# Patient Record
Sex: Female | Born: 1975 | Race: White | Hispanic: No | Marital: Single | Smoking: Current every day smoker
Health system: Southern US, Community
[De-identification: ages and names within clinical notes are randomized; demographics above are authoritative.]

## PROBLEM LIST (undated history)

## (undated) DIAGNOSIS — T4145XA Adverse effect of unspecified anesthetic, initial encounter: Secondary | ICD-10-CM

## (undated) DIAGNOSIS — I471 Supraventricular tachycardia, unspecified: Secondary | ICD-10-CM

## (undated) DIAGNOSIS — T8859XA Other complications of anesthesia, initial encounter: Secondary | ICD-10-CM

## (undated) DIAGNOSIS — E162 Hypoglycemia, unspecified: Secondary | ICD-10-CM

## (undated) DIAGNOSIS — Z8489 Family history of other specified conditions: Secondary | ICD-10-CM

## (undated) DIAGNOSIS — R0600 Dyspnea, unspecified: Secondary | ICD-10-CM

## (undated) DIAGNOSIS — D649 Anemia, unspecified: Secondary | ICD-10-CM

## (undated) DIAGNOSIS — J45909 Unspecified asthma, uncomplicated: Secondary | ICD-10-CM

## (undated) DIAGNOSIS — M1990 Unspecified osteoarthritis, unspecified site: Secondary | ICD-10-CM

## (undated) DIAGNOSIS — G43909 Migraine, unspecified, not intractable, without status migrainosus: Secondary | ICD-10-CM

## (undated) DIAGNOSIS — G709 Myoneural disorder, unspecified: Secondary | ICD-10-CM

## (undated) DIAGNOSIS — K219 Gastro-esophageal reflux disease without esophagitis: Secondary | ICD-10-CM

## (undated) HISTORY — PX: BREAST SURGERY: SHX581

## (undated) HISTORY — PX: TUBAL LIGATION: SHX77

## (undated) HISTORY — PX: ABDOMINAL HYSTERECTOMY: SHX81

---
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
## 2018-07-12 ENCOUNTER — Ambulatory Visit (INDEPENDENT_AMBULATORY_CARE_PROVIDER_SITE_OTHER): Payer: Self-pay

## 2018-07-12 ENCOUNTER — Ambulatory Visit
Admission: EM | Admit: 2018-07-12 | Discharge: 2018-07-12 | Disposition: A | Discharge status: Home / Self Care | Payer: Self-pay | Attending: Family Medicine | Admitting: Family Medicine

## 2018-07-12 ENCOUNTER — Encounter: Payer: Self-pay | Admitting: Emergency Medicine

## 2018-07-12 ENCOUNTER — Other Ambulatory Visit: Payer: Self-pay

## 2018-07-12 DIAGNOSIS — R05 Cough: Secondary | ICD-10-CM

## 2018-07-12 DIAGNOSIS — R059 Cough, unspecified: Secondary | ICD-10-CM

## 2018-07-12 DIAGNOSIS — J4521 Mild intermittent asthma with (acute) exacerbation: Secondary | ICD-10-CM

## 2018-07-12 DIAGNOSIS — R0602 Shortness of breath: Secondary | ICD-10-CM

## 2018-07-12 DIAGNOSIS — F17210 Nicotine dependence, cigarettes, uncomplicated: Secondary | ICD-10-CM

## 2018-07-12 HISTORY — DX: Supraventricular tachycardia: I47.1

## 2018-07-12 HISTORY — DX: Unspecified asthma, uncomplicated: J45.909

## 2018-07-12 HISTORY — DX: Migraine, unspecified, not intractable, without status migrainosus: G43.909

## 2018-07-12 HISTORY — DX: Supraventricular tachycardia, unspecified: I47.10

## 2018-07-12 MED ORDER — HYDROCOD POLST-CPM POLST ER 10-8 MG/5ML PO SUER: 5.0000 mL | Freq: Every evening | ORAL | 0 refills | Status: DC | PRN

## 2018-07-12 MED ORDER — PREDNISONE 20 MG PO TABS: 20.0000 mg | ORAL_TABLET | Freq: Every day | ORAL | 0 refills | Status: DC

## 2018-07-12 MED ORDER — BENZONATATE 200 MG PO CAPS: 200.0000 mg | ORAL_CAPSULE | Freq: Three times a day (TID) | ORAL | 0 refills | Status: DC | PRN

## 2018-07-12 NOTE — ED Provider Notes (Signed)
[facility]    CSN: [id number] Arrival date & time: 07/12/18  1320     History   Chief Complaint Chief Complaint  Patient presents with  . Cough  . Shortness of Breath    HPI Margalit Eisenbeis is a 43 y.o. female.   The history is provided by the patient.  Cough  Associated symptoms: shortness of breath and wheezing   Shortness of Breath  Associated symptoms: cough and wheezing   URI  Presenting symptoms: cough   Severity:  Moderate Onset quality:  Sudden Timing:  Constant Progression:  Worsening Chronicity:  New Relieved by:  Inhaler (however states having to use more) Associated symptoms: wheezing   Associated symptoms comment:  Shortness of breath Risk factors: no recent travel  Sick contacts: unknown.     Past Medical History:  Diagnosis Date  . Asthma   . Migraines   . SVT (supraventricular tachycardia) (HCC)     There are no active problems to display for this patient.   Past Surgical History:  Procedure Laterality Date  . ABDOMINAL HYSTERECTOMY    . BREAST SURGERY Right    lumpectomy  . TUBAL LIGATION      OB History   No obstetric history on file.      Home Medications    Prior to Admission medications   Medication Sig Start Date End Date Taking? Authorizing Provider  albuterol (VENTOLIN HFA) 108 (90 Base) MCG/ACT inhaler Inhale into the lungs every 6 (six) hours as needed for wheezing or shortness of breath.   Yes [provider]  benzonatate (TESSALON) 200 MG capsule Take 1 capsule (200 mg total) by mouth 3 (three) times daily as needed. 07/12/18   Payton Mccallum, MD  chlorpheniramine-HYDROcodone (TUSSIONEX PENNKINETIC ER) 10-8 MG/5ML SUER Take 5 mLs by mouth at bedtime as needed. 07/12/18   Payton Mccallum, MD  predniSONE (DELTASONE) 20 MG tablet Take 1 tablet (20 mg total) by mouth daily. 07/12/18   Payton Mccallum, MD    Family History Family History  Problem Relation Age of Onset  . Fibromyalgia Mother   . Asthma  Mother   . Cancer Mother        breast    Social History Social History   Tobacco Use  . Smoking status: Current Every Day Smoker    Packs/day: 0.50  . Smokeless tobacco: Never Used  Substance Use Topics  . Alcohol use: Not Currently  . Drug use: Not Currently     Allergies   Aspirin   Review of Systems Review of Systems  Respiratory: Positive for cough, shortness of breath and wheezing.      Physical Exam Triage Vital Signs ED Triage Vitals  Enc Vitals Group     BP 07/12/18 1341 113/71     Pulse Rate 07/12/18 1341 90     Resp 07/12/18 1341 18     Temp 07/12/18 1341 98.2 F (36.8 C)     Temp Source 07/12/18 1341 Oral     SpO2 07/12/18 1341 98 %     Weight 07/12/18 1335 124 lb (56.2 kg)     Height 07/12/18 1335 5\' 3"  (1.6 m)     Head Circumference --      Peak Flow --      Pain Score 07/12/18 1335 7     Pain Loc --      Pain Edu? --      Excl. in [place]? --    No data found.  Updated Vital  Signs BP 113/71 (BP Location: Left Arm)   Pulse 90   Temp 98.2 F (36.8 C) (Oral)   Resp 18   Ht 5\' 3"  (1.6 m)   Wt 56.2 kg   SpO2 98%   BMI 21.97 kg/m   Visual Acuity Right Eye Distance:   Left Eye Distance:   Bilateral Distance:    Right Eye Near:   Left Eye Near:    Bilateral Near:     Physical Exam Vitals signs and nursing note reviewed.  Constitutional:      General: She is not in acute distress.    Appearance: She is well-developed. She is not toxic-appearing or diaphoretic.  HENT:     Head: Normocephalic and atraumatic.  Neck:     Thyroid: No thyromegaly.  Cardiovascular:     Rate and Rhythm: Normal rate.  Pulmonary:     Effort: Pulmonary effort is normal. No respiratory distress.     Breath sounds: No stridor.  Neurological:     Mental Status: She is alert.      UC Treatments / Results  Labs (all labs ordered are listed, but only abnormal results are displayed) Labs Reviewed - No data to display  EKG None  Radiology Dg Chest 2  View  Result Date: 07/12/2018 CLINICAL DATA:  Cough, shortness of breath. EXAM: CHEST - 2 VIEW COMPARISON:  None. FINDINGS: The heart size and mediastinal contours are within normal limits. Both lungs are clear. No pneumothorax or pleural effusion is noted. Hyperexpansion of the lungs is noted. The visualized skeletal structures are unremarkable. IMPRESSION: No active cardiopulmonary disease. Electronically Signed   By: Lupita RaiderJames  Green Jr M.D.   On: 07/12/2018 14:13    Procedures Procedures (including critical care time)  Medications Ordered in UC Medications - No data to display  Initial Impression / Assessment and Plan / UC Course  I have reviewed the triage vital signs and the nursing notes.  Pertinent labs & imaging results that were available during my care of the patient were reviewed by me and considered in my medical decision making (see chart for details).      Final Clinical Impressions(s) / UC Diagnoses   Final diagnoses:  Cough  Mild intermittent asthma with acute exacerbation     Discharge Instructions     Continue albuterol inhaler Monitor symptoms Follow [place] DHHS guidelines (copy given)    ED Prescriptions    Medication Sig Dispense Auth. Provider   predniSONE (DELTASONE) 20 MG tablet Take 1 tablet (20 mg total) by mouth daily. 5 tablet Jayston Trevino, Pamala Hurryrlando, MD   benzonatate (TESSALON) 200 MG capsule Take 1 capsule (200 mg total) by mouth 3 (three) times daily as needed. 30 capsule Payton Mccallumonty, Emaree Chiu, MD   chlorpheniramine-HYDROcodone (TUSSIONEX PENNKINETIC ER) 10-8 MG/5ML SUER Take 5 mLs by mouth at bedtime as needed. 30 mL Payton Mccallumonty, Doloros Kwolek, MD      1. Chest x-ray result and diagnosis reviewed with patient 2. rx as per orders above; reviewed possible side effects, interactions, risks and benefits  3. Recommend supportive treatment as above 4. Follow-up prn if symptoms worsen or don't improve Controlled Substance Prescriptions [place] Controlled Substance Registry consulted?  Not Applicable   Payton Mccallumonty, Renalda Locklin, MD 07/12/18 1451

## 2018-07-12 NOTE — ED Triage Notes (Signed)
Pt c/o cough, and shortness of breath. She has known asthma. She has been using her inhaler but within an hour her SOB Is back. Denies fever. Started about a week and a half ago.  She also has some body aches. She works at a [employer]

## 2018-07-12 NOTE — Discharge Instructions (Signed)
Continue albuterol inhaler Monitor symptoms Follow [place] DHHS guidelines (copy given)

## 2018-09-12 ENCOUNTER — Other Ambulatory Visit: Payer: Self-pay

## 2018-09-12 ENCOUNTER — Encounter: Payer: Self-pay | Admitting: Emergency Medicine

## 2018-09-12 ENCOUNTER — Ambulatory Visit
Admission: EM | Admit: 2018-09-12 | Discharge: 2018-09-12 | Disposition: A | Discharge status: Home / Self Care | Payer: Self-pay | Attending: Family Medicine | Admitting: Family Medicine

## 2018-09-12 DIAGNOSIS — N6320 Unspecified lump in the left breast, unspecified quadrant: Secondary | ICD-10-CM

## 2018-09-12 DIAGNOSIS — K625 Hemorrhage of anus and rectum: Secondary | ICD-10-CM

## 2018-09-12 DIAGNOSIS — R1013 Epigastric pain: Secondary | ICD-10-CM

## 2018-09-12 LAB — COMPREHENSIVE METABOLIC PANEL
ALT: 15 U/L (ref 0–44)
AST: 16 U/L (ref 15–41)
Albumin: 4.5 g/dL (ref 3.5–5.0)
Alkaline Phosphatase: 69 U/L (ref 38–126)
Anion gap: 8 (ref 5–15)
BUN: 10 mg/dL (ref 6–20)
CO2: 24 mmol/L (ref 22–32)
Calcium: 9.2 mg/dL (ref 8.9–10.3)
Chloride: 105 mmol/L (ref 98–111)
Creatinine, Ser: 0.63 mg/dL (ref 0.44–1.00)
GFR calc Af Amer: >60 mL/min (ref >60)
GFR calc non Af Amer: >60 mL/min (ref >60)
Glucose, Bld: 97 mg/dL (ref 70–99)
Potassium: 3.9 mmol/L (ref 3.5–5.1)
Sodium: 137 mmol/L (ref 135–145)
Total Bilirubin: 0.7 mg/dL (ref 0.3–1.2)
Total Protein: 7.5 g/dL (ref 6.5–8.1)

## 2018-09-12 LAB — CBC WITH DIFFERENTIAL/PLATELET
Abs Immature Granulocytes: 0.01 10*3/uL (ref 0.00–0.07)
Basophils Absolute: 0.1 10*3/uL (ref 0.0–0.1)
Basophils Relative: 1 %
Eosinophils Absolute: 0 10*3/uL (ref 0.0–0.5)
Eosinophils Relative: 1 %
HCT: 45.7 % (ref 36.0–46.0)
Hemoglobin: 15.9 g/dL — ABNORMAL HIGH (ref 12.0–15.0)
Immature Granulocytes: 0 %
Lymphocytes Relative: 24 %
Lymphs Abs: 1.3 10*3/uL (ref 0.7–4.0)
MCH: 30.2 pg (ref 26.0–34.0)
MCHC: 34.8 g/dL (ref 30.0–36.0)
MCV: 86.7 fL (ref 80.0–100.0)
Monocytes Absolute: 0.4 10*3/uL (ref 0.1–1.0)
Monocytes Relative: 8 %
Neutro Abs: 3.6 10*3/uL (ref 1.7–7.7)
Neutrophils Relative %: 66 %
Platelets: 251 10*3/uL (ref 150–400)
RBC: 5.27 MIL/uL — ABNORMAL HIGH (ref 3.87–5.11)
RDW: 13.6 % (ref 11.5–15.5)
WBC: 5.5 10*3/uL (ref 4.0–10.5)
nRBC: 0 % (ref 0.0–0.2)

## 2018-09-12 MED ORDER — PANTOPRAZOLE SODIUM 20 MG PO TBEC: 40.0000 mg | DELAYED_RELEASE_TABLET | Freq: Every day | ORAL | 0 refills | Status: DC

## 2018-09-12 NOTE — Discharge Instructions (Signed)
Medication as prescribed.  Referral to GI has been sent.  I have ordered the mammogram and Ultrasound.  Labs stable.  Take care  Dr. Lacinda Axon

## 2018-09-12 NOTE — ED Triage Notes (Signed)
Patient states she has been having rectal bleeding with BM for the past 2 weeks.

## 2018-09-12 NOTE — ED Provider Notes (Signed)
[facility]    CSN: [id number] Arrival date & time: 09/12/18  3151  History   Chief Complaint Chief Complaint  Patient presents with  . Rectal Bleeding   HPI   43 year old female presents with rectal bleeding.  Patient reports a 2-week history of bleeding with bowel movements.  Patient reports bright red blood as well as dark stool.  Patient states that she has been under a great deal of stress.  She is concerned that she has a "bleeding ulcer".  Patient reports epigastric pain.  She rates her pain is 7/10 in severity.  Patient also notes that she has lost weight in the past several weeks.  She states that 3 weeks ago she weighed 125 pounds.  I put her on the scale today and she weighed 112.5 pounds.  Patient reports that it is quite a large amount of blood with her bowel movements.  She states that is predominantly blood.  She denies constipation.  Denies diarrhea.  No other reported symptoms.  No other complaints.  Past Medical History:  Diagnosis Date  . Asthma   . Migraines   . SVT (supraventricular tachycardia) (HCC)    Past Surgical History:  Procedure Laterality Date  . ABDOMINAL HYSTERECTOMY    . BREAST SURGERY Right    lumpectomy  . TUBAL LIGATION     Home Medications    Prior to Admission medications   Medication Sig Start Date End Date Taking? Authorizing Provider  albuterol (VENTOLIN HFA) 108 (90 Base) MCG/ACT inhaler Inhale into the lungs every 6 (six) hours as needed for wheezing or shortness of breath.   Yes [provider]  pantoprazole (PROTONIX) 20 MG tablet Take 2 tablets (40 mg total) by mouth daily. 09/12/18   Coral Spikes, DO    Family History Family History  Problem Relation Age of Onset  . Fibromyalgia Mother   . Asthma Mother   . Cancer Mother        breast    Social History Social History   Tobacco Use  . Smoking status: Current Every Day Smoker    Packs/day: 0.50  . Smokeless tobacco: Never Used  Substance Use Topics   . Alcohol use: Not Currently  . Drug use: Not Currently     Allergies   Aspirin   Review of Systems Review of Systems  Constitutional: Positive for unexpected weight change.  Gastrointestinal: Positive for abdominal pain and rectal pain.   Physical Exam Triage Vital Signs ED Triage Vitals  Enc Vitals Group     BP 09/12/18 0832 112/72     Pulse Rate 09/12/18 0832 85     Resp 09/12/18 0832 18     Temp 09/12/18 0832 98.3 F (36.8 C)     Temp src --      SpO2 09/12/18 0832 99 %     Weight 09/12/18 0833 110 lb (49.9 kg)     Height 09/12/18 0833 5\' 3"  (1.6 m)     Head Circumference --      Peak Flow --      Pain Score 09/12/18 0833 7     Pain Loc --      Pain Edu? --      Excl. in [place]? --    Updated Vital Signs BP 112/72 (BP Location: Left Arm)   Pulse 85   Temp 98.3 F (36.8 C)   Resp 18   Ht 5\' 3"  (1.6 m)   Wt 49.9 kg   SpO2 99%  BMI 19.49 kg/m   Visual Acuity Right Eye Distance:   Left Eye Distance:   Bilateral Distance:    Right Eye Near:   Left Eye Near:    Bilateral Near:     Physical Exam Constitutional:      Comments: Thin female no acute distress.  HENT:     Head: Normocephalic and atraumatic.  Eyes:     General:        Right eye: No discharge.        Left eye: No discharge.     Conjunctiva/sclera: Conjunctivae normal.  Cardiovascular:     Rate and Rhythm: Normal rate and regular rhythm.     Comments: Of note, when I placed my stethoscope on the right side of her chest she reported to me that she has a right breast lump that she has had for 6 months. Pulmonary:     Effort: Pulmonary effort is normal.     Comments: Wheezing noted. Abdominal:     General: There is no distension.     Palpations: Abdomen is soft.     Comments: Exquisitely tender to palpation in the epigastric region.  Neurological:     Mental Status: She is alert.  Psychiatric:        Mood and Affect: Mood normal.        Behavior: Behavior normal.   Right breast  -patient with tenderness laterally.  Patient has palpable mass located at the 1 o'clock position approximately 8 cm from the nipple.  Nontender.  Scar noted from prior lumpectomy.  Left breast -dense breast tissue.  No discrete mass noted.  UC Treatments / Results  Labs (all labs ordered are listed, but only abnormal results are displayed) Labs Reviewed  CBC WITH DIFFERENTIAL/PLATELET - Abnormal; Notable for the following components:      Result Value   RBC 5.27 (*)    Hemoglobin 15.9 (*)    All other components within normal limits  COMPREHENSIVE METABOLIC PANEL    EKG   Radiology No results found.  Procedures Procedures (including critical care time)  Medications Ordered in UC Medications - No data to display  Initial Impression / Assessment and Plan / UC Course  I have reviewed the triage vital signs and the nursing notes.  Pertinent labs & imaging results that were available during my care of the patient were reviewed by me and considered in my medical decision making (see chart for details).    43 year old female presents with rectal bleeding, epigastric pain, and also has a left breast lump.  Labs stable.  Referring to GI.  Placing on Protonix.  Order placed for mammogram and ultrasound.  Final Clinical Impressions(s) / UC Diagnoses   Final diagnoses:  Rectal bleeding  Abdominal pain, epigastric  Left breast lump     Discharge Instructions     Medication as prescribed.  Referral to GI has been sent.  I have ordered the mammogram and Ultrasound.  Labs stable.  Take care  Dr. Adriana Simasook     ED Prescriptions    Medication Sig Dispense Auth. Provider   pantoprazole (PROTONIX) 20 MG tablet Take 2 tablets (40 mg total) by mouth daily. 90 tablet Tommie Samsook, Amanee Iacovelli G, DO     Controlled Substance Prescriptions [place] Controlled Substance Registry consulted? Not Applicable   Tommie SamsCook, Rosa Gambale G, [place] 09/12/18 [phone]

## 2018-09-18 ENCOUNTER — Other Ambulatory Visit: Payer: Self-pay

## 2018-09-18 ENCOUNTER — Encounter: Payer: Self-pay | Admitting: Gastroenterology

## 2018-09-18 ENCOUNTER — Ambulatory Visit (INDEPENDENT_AMBULATORY_CARE_PROVIDER_SITE_OTHER): Payer: Self-pay | Admitting: Gastroenterology

## 2018-09-18 VITALS — BP 100/64 | HR 90 | Temp 98.6°F | Ht 63.0 in | Wt 121.0 lb

## 2018-09-18 DIAGNOSIS — R1013 Epigastric pain: Secondary | ICD-10-CM

## 2018-09-18 DIAGNOSIS — K625 Hemorrhage of anus and rectum: Secondary | ICD-10-CM

## 2018-09-18 MED ORDER — NA SULFATE-K SULFATE-MG SULF 17.5-3.13-1.6 GM/177ML PO SOLN: 1.0000 | Freq: Once | ORAL | 0 refills | Status: AC

## 2018-09-18 NOTE — Patient Instructions (Addendum)
PCP recommendation:  [facility] - Dr. Ancil Boozer _[place] location  [facility] - any provider - [place] - any provider- [facility] - any provider - [facility]

## 2018-09-18 NOTE — Progress Notes (Signed)
Kellie Jensen [address]  [place]  [place], [place] [address]  Main: [phone]  Fax: [phone]   Gastroenterology Consultation  Referring Provider:     Coral Spikes, DO Primary Care Physician:  Patient, No Pcp Per Reason for Consultation:    Abdominal pain, blood in stool        HPI:    Chief Complaint  Patient presents with  . Rectal Bleeding    Kellie Jensen is a 43 y.o. y/o female referred for consultation & management  by Dr. Patient, No Pcp Per.  Patient reports 24-monthhistory of epigastric abdominal pain associated with nausea and vomiting.  States occurs with anything that she eats.  Reports subjective weight loss due to this as well.  Also reports intermittent dysphagia during this time to solid foods.  Reports having multiple loose bowel movements a day for the last 2 to 3 weeks, associated with red and black blood streaks in the stool.  States today was the first time she had a regular bowel movement that was formed but was black in color.  Reports occasional ibuprofen use about once every 2 weeks for migraines.  No other NSAID use.  No prior EGD or colonoscopy.  [facility] for her symptoms.  She was discharged with Protonix which she has been taking for the last 3 to 4 days and states that has not helped her abdominal pain.  [facility] suspected [facility]stitis and UA was positive and patient was given antibiotics that she is still taking.  Hemoglobin at both visits was normal.  Patient recently moved to the area and is working on establishing a primary care provider.  Past Medical History:  Diagnosis Date  . Asthma   . Migraines   . SVT (supraventricular tachycardia) (HCC)     Past Surgical History:  Procedure Laterality Date  . ABDOMINAL HYSTERECTOMY    . BREAST SURGERY Right    lumpectomy  . TUBAL LIGATION      Prior to Admission medications   Medication Sig Start  Date End Date Taking? Authorizing Provider  albuterol (VENTOLIN HFA) 108 (90 Base) MCG/ACT inhaler Inhale into the lungs every 6 (six) hours as needed for wheezing or shortness of breath.   Yes [provider]  ondansetron (ZOFRAN-ODT) 4 MG disintegrating tablet Take by mouth. 09/15/18 09/22/18 Yes [provider]  pantoprazole (PROTONIX) 20 MG tablet Take 2 tablets (40 mg total) by mouth daily. 09/12/18  Yes Cook, Jayce G, DO  promethazine (PHENERGAN) 25 MG tablet Take by mouth. 09/15/18 09/22/18 Yes [provider]    Family History  Problem Relation Age of Onset  . Fibromyalgia Mother   . Asthma Mother   . Cancer Mother        breast     Social History   Tobacco Use  . Smoking status: Current Every Day Smoker    Packs/day: 0.50  . Smokeless tobacco: Never Used  Substance Use Topics  . Alcohol use: Not Currently  . Drug use: Not Currently    Allergies as of 09/18/2018 - Review Complete 09/18/2018  Allergen Reaction Noted  . Aspirin Swelling and Anaphylaxis 07/12/2018  . Diphenhydramine hcl Anaphylaxis 09/15/2018    Review of Systems:    All systems reviewed and negative except where noted in HPI.   Physical Exam:  BP 100/64   Pulse 90   Temp 98.6  F (37 C) (Oral)   Ht _0  (1.6 m)   Wt 121 lb (54.9 kg)   BMI 21.43 kg/m  No LMP recorded. Patient has had a hysterectomy. Psych:  Alert and cooperative. Normal mood and affect. General:   Alert,  Well-developed, well-nourished, pleasant and cooperative in NAD Head:  Normocephalic and atraumatic. Eyes:  Sclera clear, no icterus.   Conjunctiva pink. Ears:  Normal auditory acuity. Nose:  No deformity, discharge, or lesions. Mouth:  No deformity or lesions,oropharynx pink & moist. Neck:  Supple; no masses or thyromegaly. Abdomen:  Normal bowel sounds.  No bruits.  Soft, non-tender and non-distended without masses, hepatosplenomegaly or hernias noted.  No guarding or rebound tenderness.    Msk:   Symmetrical without gross deformities. Good, equal movement & strength bilaterally. Pulses:  Normal pulses noted. Extremities:  No clubbing or edema.  No cyanosis. Neurologic:  Alert and oriented x3;  grossly normal neurologically. Skin:  Intact without significant lesions or rashes. No jaundice. Lymph Nodes:  No significant cervical adenopathy. Psych:  Alert and cooperative. Normal mood and affect.   Labs: CBC    Component Value Date/Time   WBC 5.5 09/12/2018 0859   RBC 5.27 (H) 09/12/2018 0859   HGB 15.9 (H) 09/12/2018 0859   HCT 45.7 09/12/2018 0859   PLT 251 09/12/2018 0859   MCV 86.7 09/12/2018 0859   MCH 30.2 09/12/2018 0859   MCHC 34.8 09/12/2018 0859   RDW 13.6 09/12/2018 0859   LYMPHSABS 1.3 09/12/2018 0859   MONOABS 0.4 09/12/2018 0859   EOSABS 0.0 09/12/2018 0859   BASOSABS 0.1 09/12/2018 0859   CMP     Component Value Date/Time   NA 137 09/12/2018 0859   K 3.9 09/12/2018 0859   CL 105 09/12/2018 0859   CO2 24 09/12/2018 0859   GLUCOSE 97 09/12/2018 0859   BUN 10 09/12/2018 0859   CREATININE 0.63 09/12/2018 0859   CALCIUM 9.2 09/12/2018 0859   PROT 7.5 09/12/2018 0859   ALBUMIN 4.5 09/12/2018 0859   AST 16 09/12/2018 0859   ALT 15 09/12/2018 0859   ALKPHOS 69 09/12/2018 0859   BILITOT 0.7 09/12/2018 0859   GFRNONAA >60 09/12/2018 0859   GFRAA >60 09/12/2018 0859    Imaging Studies: No results found.  Assessment and Plan:   Kellie Jensen is a 43 y.o. y/o female has been referred for abdominal pain and blood in stool  Patient symptoms could be related to gastroenteritis since blood in stool started about 2 to 3 weeks ago.  However her abdominal pain and nausea vomiting has been going on for 4 months which is not typical for gastroenteritis.  We will obtain GI panel to rule out infectious causes Also obtain CRP and ESR  Labs including hemoglobin electrolytes and liver enzymes otherwise reassuring last week  We will schedule patient for EGD and  colonoscopy given intermittently black and red stool to rule out IBD, ulceration etc.  However, if stool test is positive for infection, will need to delay procedures till later time.  Will allow patient to complete treatment for positive UA prior to the procedures as well  Patient was given a list of primary care providers in the area and importance of establishing a PCP was discussed in detail.  They would need to follow her positive UA and cystitis reported on CT scan to see if it needs further work-up.  I have discussed alternative options, risks & benefits,  which include, but are not limited to,  bleeding, infection, perforation,respiratory complication & drug reaction.  The patient agrees with this plan & written consent will be obtained.    Avoid NSAID use such as Ibuprofen, Aleeve, advil, motrin, BC and Goodie powder, Naproxen, Meloxicam and others.     Dr Kellie Jensen  Speech recognition software was used to dictate the above note.

## 2018-09-19 ENCOUNTER — Other Ambulatory Visit: Payer: Self-pay | Admitting: Gastroenterology

## 2018-09-19 ENCOUNTER — Other Ambulatory Visit: Payer: Self-pay

## 2018-09-19 DIAGNOSIS — R1013 Epigastric pain: Secondary | ICD-10-CM

## 2018-09-19 DIAGNOSIS — K625 Hemorrhage of anus and rectum: Secondary | ICD-10-CM

## 2018-09-19 LAB — C-REACTIVE PROTEIN: CRP: <1 mg/L (ref 0–10)

## 2018-09-19 LAB — SEDIMENTATION RATE: Sed Rate: 2 mm/hr (ref 0–32)

## 2018-09-20 ENCOUNTER — Other Ambulatory Visit
Admission: RE | Admit: 2018-09-20 | Discharge: 2018-09-20 | Disposition: A | Discharge status: Home / Self Care | Payer: HRSA Program | Source: Ambulatory Visit | Attending: Gastroenterology | Admitting: Gastroenterology

## 2018-09-20 ENCOUNTER — Other Ambulatory Visit: Payer: Self-pay

## 2018-09-20 DIAGNOSIS — Z01812 Encounter for preprocedural laboratory examination: Secondary | ICD-10-CM | POA: Diagnosis present

## 2018-09-20 DIAGNOSIS — Z1159 Encounter for screening for other viral diseases: Secondary | ICD-10-CM | POA: Insufficient documentation

## 2018-09-21 LAB — SARS CORONAVIRUS 2 (TAT 6-24 HRS): SARS Coronavirus 2: NEGATIVE

## 2018-09-22 NOTE — Anesthesia Preprocedure Evaluation (Addendum)
Anesthesia Evaluation  Patient identified by MRN, date of birth, ID band Patient awake    Reviewed: Allergy & Precautions, NPO status , Patient's Chart, lab work & pertinent test results  History of Anesthesia Complications (+) Family history of anesthesia reaction and history of anesthetic complications (daughter with allergy to local anesthesia)  Airway Mallampati: I   Neck ROM: Full    Dental  (+) Edentulous Upper, Edentulous Lower   Pulmonary asthma , Current Smoker (smokes 5 cigarettes per day),    Pulmonary exam normal breath sounds clear to auscultation       Cardiovascular Normal cardiovascular exam+ dysrhythmias Supra Ventricular Tachycardia  Rhythm:Regular Rate:Normal     Neuro/Psych  Headaches,    GI/Hepatic Rectal bleeding   Endo/Other  negative endocrine ROS  Renal/GU negative Renal ROS     Musculoskeletal  (+) Arthritis ,   Abdominal   Peds  Hematology  (+) Blood dyscrasia, anemia ,   Anesthesia Other Findings   Reproductive/Obstetrics                            Anesthesia Physical Anesthesia Plan  ASA: II  Anesthesia Plan: General   Post-op Pain Management:    Induction:   PONV Risk Score and Plan: 2 and Propofol infusion and TIVA  Airway Management Planned: Natural Airway  Additional Equipment:   Intra-op Plan:   Post-operative Plan:   Informed Consent: I have reviewed the patients History and Physical, chart, labs and discussed the procedure including the risks, benefits and alternatives for the proposed anesthesia with the patient or authorized representative who has indicated his/her understanding and acceptance.       Plan Discussed with: CRNA  Anesthesia Plan Comments:        Anesthesia Quick Evaluation

## 2018-09-23 ENCOUNTER — Telehealth: Payer: Self-pay | Admitting: Gastroenterology

## 2018-09-23 NOTE — Discharge Instructions (Signed)

## 2018-09-23 NOTE — Telephone Encounter (Signed)
Pt dropped her stool specimen off last week but results are not back. Will she still be okay to do her Colonoscopy?

## 2018-09-23 NOTE — Telephone Encounter (Signed)
Pt is calling to check on her fecal test results so she can have the procedure tomorrow

## 2018-09-24 ENCOUNTER — Ambulatory Visit: Payer: Self-pay | Admitting: Anesthesiology

## 2018-09-24 ENCOUNTER — Ambulatory Visit: Admit: 2018-09-24 | Payer: Self-pay | Admitting: Gastroenterology

## 2018-09-24 ENCOUNTER — Encounter
Admission: RE | Disposition: A | Discharge status: Home / Self Care | Payer: Self-pay | Source: Home / Self Care | Attending: Gastroenterology

## 2018-09-24 ENCOUNTER — Ambulatory Visit
Admission: RE | Admit: 2018-09-24 | Discharge: 2018-09-24 | Disposition: A | Discharge status: Home / Self Care | Payer: Self-pay | Attending: Gastroenterology | Admitting: Gastroenterology

## 2018-09-24 DIAGNOSIS — Z825 Family history of asthma and other chronic lower respiratory diseases: Secondary | ICD-10-CM | POA: Insufficient documentation

## 2018-09-24 DIAGNOSIS — R109 Unspecified abdominal pain: Secondary | ICD-10-CM

## 2018-09-24 DIAGNOSIS — R197 Diarrhea, unspecified: Secondary | ICD-10-CM | POA: Insufficient documentation

## 2018-09-24 DIAGNOSIS — M19042 Primary osteoarthritis, left hand: Secondary | ICD-10-CM | POA: Insufficient documentation

## 2018-09-24 DIAGNOSIS — Z7951 Long term (current) use of inhaled steroids: Secondary | ICD-10-CM | POA: Insufficient documentation

## 2018-09-24 DIAGNOSIS — G43909 Migraine, unspecified, not intractable, without status migrainosus: Secondary | ICD-10-CM | POA: Insufficient documentation

## 2018-09-24 DIAGNOSIS — R1319 Other dysphagia: Secondary | ICD-10-CM

## 2018-09-24 DIAGNOSIS — R1310 Dysphagia, unspecified: Secondary | ICD-10-CM | POA: Insufficient documentation

## 2018-09-24 DIAGNOSIS — Z8269 Family history of other diseases of the musculoskeletal system and connective tissue: Secondary | ICD-10-CM | POA: Insufficient documentation

## 2018-09-24 DIAGNOSIS — I471 Supraventricular tachycardia: Secondary | ICD-10-CM | POA: Insufficient documentation

## 2018-09-24 DIAGNOSIS — Z79899 Other long term (current) drug therapy: Secondary | ICD-10-CM | POA: Insufficient documentation

## 2018-09-24 DIAGNOSIS — K6289 Other specified diseases of anus and rectum: Secondary | ICD-10-CM

## 2018-09-24 DIAGNOSIS — Z886 Allergy status to analgesic agent status: Secondary | ICD-10-CM | POA: Insufficient documentation

## 2018-09-24 DIAGNOSIS — M19041 Primary osteoarthritis, right hand: Secondary | ICD-10-CM | POA: Insufficient documentation

## 2018-09-24 DIAGNOSIS — R112 Nausea with vomiting, unspecified: Secondary | ICD-10-CM | POA: Insufficient documentation

## 2018-09-24 DIAGNOSIS — M479 Spondylosis, unspecified: Secondary | ICD-10-CM | POA: Insufficient documentation

## 2018-09-24 DIAGNOSIS — K625 Hemorrhage of anus and rectum: Secondary | ICD-10-CM

## 2018-09-24 DIAGNOSIS — D649 Anemia, unspecified: Secondary | ICD-10-CM | POA: Insufficient documentation

## 2018-09-24 DIAGNOSIS — K644 Residual hemorrhoidal skin tags: Secondary | ICD-10-CM | POA: Insufficient documentation

## 2018-09-24 DIAGNOSIS — Z90710 Acquired absence of both cervix and uterus: Secondary | ICD-10-CM | POA: Insufficient documentation

## 2018-09-24 DIAGNOSIS — F17210 Nicotine dependence, cigarettes, uncomplicated: Secondary | ICD-10-CM | POA: Insufficient documentation

## 2018-09-24 DIAGNOSIS — R1110 Vomiting, unspecified: Secondary | ICD-10-CM

## 2018-09-24 DIAGNOSIS — Z803 Family history of malignant neoplasm of breast: Secondary | ICD-10-CM | POA: Insufficient documentation

## 2018-09-24 DIAGNOSIS — Z888 Allergy status to other drugs, medicaments and biological substances status: Secondary | ICD-10-CM | POA: Insufficient documentation

## 2018-09-24 DIAGNOSIS — J45909 Unspecified asthma, uncomplicated: Secondary | ICD-10-CM | POA: Insufficient documentation

## 2018-09-24 DIAGNOSIS — E162 Hypoglycemia, unspecified: Secondary | ICD-10-CM | POA: Insufficient documentation

## 2018-09-24 DIAGNOSIS — K219 Gastro-esophageal reflux disease without esophagitis: Secondary | ICD-10-CM | POA: Insufficient documentation

## 2018-09-24 DIAGNOSIS — Z7982 Long term (current) use of aspirin: Secondary | ICD-10-CM | POA: Insufficient documentation

## 2018-09-24 HISTORY — DX: Anemia, unspecified: D64.9

## 2018-09-24 HISTORY — PX: ESOPHAGOGASTRODUODENOSCOPY (EGD) WITH PROPOFOL: SHX5813

## 2018-09-24 HISTORY — DX: Unspecified osteoarthritis, unspecified site: M19.90

## 2018-09-24 HISTORY — DX: Hypoglycemia, unspecified: E16.2

## 2018-09-24 HISTORY — DX: Other complications of anesthesia, initial encounter: T88.59XA

## 2018-09-24 HISTORY — DX: Family history of other specified conditions: Z84.89

## 2018-09-24 HISTORY — DX: Gastro-esophageal reflux disease without esophagitis: K21.9

## 2018-09-24 HISTORY — PX: COLONOSCOPY WITH PROPOFOL: SHX5780

## 2018-09-24 HISTORY — DX: Myoneural disorder, unspecified: G70.9

## 2018-09-24 HISTORY — DX: Dyspnea, unspecified: R06.00

## 2018-09-24 LAB — GI PROFILE, STOOL, PCR

## 2018-09-24 SURGERY — COLONOSCOPY WITH PROPOFOL
Anesthesia: Choice

## 2018-09-24 SURGERY — COLONOSCOPY WITH PROPOFOL
Anesthesia: General | Site: Rectum

## 2018-09-24 MED ORDER — LACTATED RINGERS IV SOLN
INTRAVENOUS | Status: DC | PRN
  Administered 2018-09-24: 11:00:00 via INTRAVENOUS

## 2018-09-24 MED ORDER — PROPOFOL 10 MG/ML IV BOLUS
INTRAVENOUS | Status: DC | PRN
  Administered 2018-09-24: 50 mg via INTRAVENOUS
  Administered 2018-09-24: 100 mg via INTRAVENOUS
  Administered 2018-09-24: 50 mg via INTRAVENOUS
  Administered 2018-09-24 (×2): 100 mg via INTRAVENOUS

## 2018-09-24 MED ORDER — ACETAMINOPHEN 160 MG/5ML PO SOLN: 325.0000 mg | ORAL | Status: DC | PRN

## 2018-09-24 MED ORDER — ACETAMINOPHEN 325 MG PO TABS: 650.0000 mg | ORAL_TABLET | Freq: Once | ORAL | Status: DC | PRN

## 2018-09-24 MED ORDER — ONDANSETRON HCL 4 MG/2ML IJ SOLN: 4.0000 mg | Freq: Once | INTRAMUSCULAR | Status: DC | PRN

## 2018-09-24 MED ORDER — LIDOCAINE HCL (CARDIAC) PF 100 MG/5ML IV SOSY
PREFILLED_SYRINGE | INTRAVENOUS | Status: DC | PRN
  Administered 2018-09-24: 50 mg via INTRAVENOUS

## 2018-09-24 MED ORDER — STERILE WATER FOR IRRIGATION IR SOLN
Status: DC | PRN
  Administered 2018-09-24: 11:00:00 15 mL
  Administered 2018-09-24: 30 mL

## 2018-09-24 MED ORDER — GLYCOPYRROLATE 0.2 MG/ML IJ SOLN
INTRAMUSCULAR | Status: DC | PRN
  Administered 2018-09-24: 0.2 mg via INTRAVENOUS

## 2018-09-24 SURGICAL SUPPLY — 7 items
BLOCK BITE 60FR ADLT L/F GRN (MISCELLANEOUS) ×4 IMPLANT
CANISTER SUCT 1200ML W/VALVE (MISCELLANEOUS) ×4 IMPLANT
FORCEPS BIOP RAD 4 LRG CAP 4 (CUTTING FORCEPS) ×2 IMPLANT
GOWN CVR UNV OPN BCK APRN NK (MISCELLANEOUS) ×4 IMPLANT
GOWN ISOL THUMB LOOP REG UNIV (MISCELLANEOUS) ×4
KIT ENDO PROCEDURE OLY (KITS) ×4 IMPLANT
WATER STERILE IRR 250ML POUR (IV SOLUTION) ×4 IMPLANT

## 2018-09-24 NOTE — Op Note (Signed)
[facility] Gastroenterology Patient Name: Kellie QuailsJessica Jensen Procedure Date: 09/24/2018 11:03 AM MRN: [id number] Account #: [id number] Date of Birth: 05-23-1975 Admit Type: Outpatient Age: [age] [facility] Gender: Female Note Status: Finalized Procedure:            Upper GI endoscopy Indications:          Abdominal pain, Dysphagia, Nausea with vomiting Providers:            Amaurie Wandel B. Maximino Greenlandahiliani MD, MD Medicines:            Monitored Anesthesia Care Complications:        No immediate complications. Procedure:            Pre-Anesthesia Assessment:                       - Prior to the procedure, a History and Physical was                        performed, and patient medications, allergies and                        sensitivities were reviewed. The patient's tolerance of                        previous anesthesia was reviewed.                       - The risks and benefits of the procedure and the                        sedation options and risks were discussed with the                        patient. All questions were answered and informed                        consent was obtained.                       - Patient identification and proposed procedure were                        verified prior to the procedure by the physician, the                        nurse, the anesthesiologist, the anesthetist and the                        technician. The procedure was verified in the procedure                        room.                       - ASA Grade Assessment: II - A patient with mild                        systemic disease.                       After obtaining informed consent, the endoscope was  passed under direct vision. Throughout the procedure,                        the patient's blood pressure, pulse, and oxygen                        saturations were monitored continuously. The Endoscope                        was introduced  through the mouth, and advanced to the                        second part of duodenum. The upper GI endoscopy was                        accomplished with ease. The patient tolerated the                        procedure well. Findings:      The examined esophagus was normal. Biopsies were obtained from the       proximal and distal esophagus with cold forceps for histology of       suspected eosinophilic esophagitis.      The entire examined stomach was normal. Biopsies were obtained in the       gastric body, at the incisura and in the gastric antrum with cold       forceps for histology. Biopsies were taken with a cold forceps for       Helicobacter pylori testing.      The duodenal bulb, second portion of the duodenum and examined duodenum       were normal. Impression:           - Normal esophagus. Biopsied.                       - Normal stomach. Biopsied.                       - Normal duodenal bulb, second portion of the duodenum                        and examined duodenum.                       - Biopsies were obtained in the gastric body, at the                        incisura and in the gastric antrum. Recommendation:       - Await pathology results.                       - Discharge patient to home (with escort).                       - Advance diet as tolerated.                       - Continue present medications.                       - Patient has a contact number available for  emergencies. The signs and symptoms of potential                        delayed complications were discussed with the patient.                        Return to normal activities tomorrow. Written discharge                        instructions were provided to the patient.                       - Discharge patient to home (with escort).                       - The findings and recommendations were discussed with                        the patient.                       - The  findings and recommendations were discussed with                        the patient's family. Procedure Code(s):    --- Professional ---                       [phone], Esophagogastroduodenoscopy, flexible, transoral;                        with biopsy, single or multiple Diagnosis Code(s):    --- Professional ---                       R10.9, Unspecified abdominal pain                       R13.10, Dysphagia, unspecified                       R11.2, Nausea with vomiting, unspecified CPT copyright 2019 American Medical Association. All rights reserved. The codes documented in this report are preliminary and upon coder review may  be revised to meet current compliance requirements.  Vonda Antigua, MD Margretta Sidle B. Bonna Gains MD, MD 09/24/2018 11:29:00 AM This report has been signed electronically. Number of Addenda: 0 Note Initiated On: 09/24/2018 11:03 AM Total Procedure Duration: 0 hours 9 minutes 2 seconds  Estimated Blood Loss: Estimated blood loss: none.      [facility]

## 2018-09-24 NOTE — Transfer of Care (Signed)
Immediate Anesthesia Transfer of Care Note  Patient: Kellie Jensen  Procedure(s) Performed: COLONOSCOPY WITH BIOPSY (N/A Rectum) ESOPHAGOGASTRODUODENOSCOPY (EGD) WITH BIOPSY (N/A Esophagus)  Patient Location: PACU  Anesthesia Type: General  Level of Consciousness: awake, alert  and patient cooperative  Airway and Oxygen Therapy: Patient Spontanous Breathing and Patient connected to supplemental oxygen  Post-op Assessment: Post-op Vital signs reviewed, Patient's Cardiovascular Status Stable, Respiratory Function Stable, Patent Airway and No signs of Nausea or vomiting  Post-op Vital Signs: Reviewed and stable  Complications: No apparent anesthesia complications

## 2018-09-24 NOTE — Anesthesia Postprocedure Evaluation (Signed)
Anesthesia Post Note  Patient: Kellie Jensen  Procedure(s) Performed: COLONOSCOPY WITH BIOPSY (N/A Rectum) ESOPHAGOGASTRODUODENOSCOPY (EGD) WITH BIOPSY (N/A Esophagus)  Patient location during evaluation: PACU Anesthesia Type: General Level of consciousness: awake and alert, oriented and patient cooperative Pain management: pain level controlled Vital Signs Assessment: post-procedure vital signs reviewed and stable Respiratory status: spontaneous breathing, nonlabored ventilation and respiratory function stable Cardiovascular status: blood pressure returned to baseline and stable Postop Assessment: adequate PO intake Anesthetic complications: no    Darrin Nipper

## 2018-09-24 NOTE — Op Note (Signed)
[facility] Gastroenterology Patient Name: Kellie Jensen Procedure Date: 09/24/2018 11:03 AM MRN: [id number] Account #: [id number] Date of Birth: 1975-07-12 Admit Type: Outpatient Age: 43 [facility] Gender: Female Note Status: Finalized Procedure:            Colonoscopy Indications:          Diarrhea, Rectal bleeding Providers:            Darriel Sinquefield B. Maximino Greenlandahiliani MD, MD Medicines:            Monitored Anesthesia Care Complications:        No immediate complications. Procedure:            Pre-Anesthesia Assessment:                       - Prior to the procedure, a History and Physical was                        performed, and patient medications, allergies and                        sensitivities were reviewed. The patient's tolerance of                        previous anesthesia was reviewed.                       - The risks and benefits of the procedure and the                        sedation options and risks were discussed with the                        patient. All questions were answered and informed                        consent was obtained.                       - Patient identification and proposed procedure were                        verified prior to the procedure by the physician, the                        nurse, the anesthetist and the technician. The                        procedure was verified in the pre-procedure area in the                        procedure room in the endoscopy suite.                       - ASA Grade Assessment: II - A patient with mild                        systemic disease.                       - After reviewing the risks and benefits, the patient  was deemed in satisfactory condition to undergo the                        procedure.                       After obtaining informed consent, the colonoscope was                        passed under direct vision. Throughout the procedure,                    the patient's blood pressure, pulse, and oxygen                        saturations were monitored continuously. The was                        introduced through the anus and advanced to the the                        cecum, identified by appendiceal orifice and ileocecal                        valve. The colonoscopy was performed with ease. The                        patient tolerated the procedure well. The quality of                        the bowel preparation was good. Findings:      The perianal and digital rectal examinations were normal.      The rectum, sigmoid colon, descending colon, transverse colon, ascending       colon and cecum appeared normal. Biopsies for histology were taken with       a cold forceps from the right colon and left colon for evaluation of       microscopic colitis.      Anal papilla(e) were hypertrophied.      No additional abnormalities were found on retroflexion. Impression:           - The rectum, sigmoid colon, descending colon,                        transverse colon, ascending colon and cecum are normal.                        Biopsied.                       - Anal papilla(e) were hypertrophied. Recommendation:       - Await pathology results.                       - Discharge patient to home.                       - Resume previous diet.                       - Continue present medications.                       -  Repeat colonoscopy in 10 years for screening purposes.                       - Return to primary care physician as previously                        scheduled.                       - The findings and recommendations were discussed with                        the patient.                       - The findings and recommendations were discussed with                        the patient's family.                       - Follow up with PCP within 2-4 weeks Procedure Code(s):    --- Professional ---                        [phone], Colonoscopy, flexible; with biopsy, single or                        multiple Diagnosis Code(s):    --- Professional ---                       K62.89, Other specified diseases of anus and rectum                       R19.7, Diarrhea, unspecified                       K62.5, Hemorrhage of anus and rectum CPT copyright 2019 American Medical Association. All rights reserved. The codes documented in this report are preliminary and upon coder review may  be revised to meet current compliance requirements.  Melodie BouillonVarnita Aracelys Glade, MD Michel BickersVarnita B. Maximino Greenlandahiliani MD, MD 09/24/2018 11:54:25 AM This report has been signed electronically. Number of Addenda: 0 Note Initiated On: 09/24/2018 11:03 AM Scope Withdrawal Time: 0 hours 13 minutes 3 seconds  Total Procedure Duration: 0 hours 20 minutes 55 seconds  Estimated Blood Loss: Estimated blood loss: none.      [facility]

## 2018-09-24 NOTE — H&P (Signed)
Vonda Antigua, MD [address] [place], [place], [place], [address] [address], [place], [place], [place], [address] Phone: [phone]  Fax: [phone]  Primary Care Physician:  Patient, No Pcp Per   Pre-Procedure History & Physical: HPI:  Kellie Jensen is a 43 y.o. female is here for a colonoscopy and EGD.   Past Medical History:  Diagnosis Date  . Anemia    H/O  . Arthritis    hands and back  . Asthma   . Complication of anesthesia    stopped breathing with hysterectomy, but since no problems  . Dyspnea   . Family history of adverse reaction to anesthesia    daughter "allergic" to local anesthesia  . GERD (gastroesophageal reflux disease)   . Hypoglycemic syndrome   . Migraines    migraines  . Neuromuscular disorder ([facility])    some weakness/numbness when stressed  . SVT (supraventricular tachycardia) (HCC)     Past Surgical History:  Procedure Laterality Date  . ABDOMINAL HYSTERECTOMY    . BREAST SURGERY Right    lumpectomy  . TUBAL LIGATION      Prior to Admission medications   Medication Sig Start Date End Date Taking? Authorizing Provider  albuterol (VENTOLIN HFA) 108 (90 Base) MCG/ACT inhaler Inhale into the lungs every 6 (six) hours as needed for wheezing or shortness of breath.   Yes [provider]  omeprazole (PRILOSEC) 20 MG capsule Take 20 mg by mouth daily.   Yes [provider]  pantoprazole (PROTONIX) 20 MG tablet Take 2 tablets (40 mg total) by mouth daily. 09/12/18  Yes Cook, Jayce G, DO  promethazine (PHENERGAN) 25 MG tablet Take by mouth. 09/15/18 09/22/18  [provider]    Allergies as of 09/18/2018 - Review Complete 09/18/2018  Allergen Reaction Noted  . Aspirin Swelling and Anaphylaxis 07/12/2018  . Diphenhydramine hcl Anaphylaxis 09/15/2018    Family History  Problem Relation Age of Onset  . Fibromyalgia Mother   . Asthma Mother   . Cancer Mother        breast    Social History    Socioeconomic History  . Marital status: Single    Spouse name: Not on file  . Number of children: Not on file  . Years of education: Not on file  . Highest education level: Not on file  Occupational History  . Not on file  Social Needs  . Financial resource strain: Not on file  . Food insecurity    Worry: Not on file    Inability: Not on file  . Transportation needs    Medical: Not on file    Non-medical: Not on file  Tobacco Use  . Smoking status: Current Every Day Smoker    Packs/day: 0.50    Years: 28.00    Pack years: 14.00  . Smokeless tobacco: Never Used  . Tobacco comment: used to smoke 2 ppd but is trying to quit  Substance and Sexual Activity  . Alcohol use: Not Currently  . Drug use: Not Currently  . Sexual activity: Not on file  Lifestyle  . Physical activity    Days per week: Not on file    Minutes per session: Not on file  . Stress: Not on file  Relationships  . Social Herbalist on phone: Not on file    Gets together: Not on file    Attends religious service: Not on file    Active member of club or organization: Not  on file    Attends meetings of clubs or organizations: Not on file    Relationship status: Not on file  . Intimate partner violence    Fear of current or ex partner: Not on file    Emotionally abused: Not on file    Physically abused: Not on file    Forced sexual activity: Not on file  Other Topics Concern  . Not on file  Social History Narrative  . Not on file    Review of Systems: See HPI, otherwise negative ROS  Physical Exam: BP 103/80   Pulse 77   Temp 97.8 F (36.6 C) (Temporal)   Ht 5\' 3"  (1.6 m)   Wt 50.3 kg   SpO2 99%   BMI 19.66 kg/m  General:   Alert,  pleasant and cooperative in NAD Head:  Normocephalic and atraumatic. Neck:  Supple; no masses or thyromegaly. Lungs:  Clear throughout to auscultation, normal respiratory effort.    Heart:  +S1, +S2, Regular rate and rhythm, No edema. Abdomen:  Soft,  nontender and nondistended. Normal bowel sounds, without guarding, and without rebound.   Neurologic:  Alert and  oriented x4;  grossly normal neurologically.  Impression/Plan: Kellie Jensen is here for a colonoscopy to be performed for diarrhea, blood in stool and EGD for dysphagia, nausea/vomiting  Risks, benefits, limitations, and alternatives regarding the procedures have been reviewed with the patient.  Questions have been answered.  All parties agreeable.   Pasty SpillersVarnita B Royelle Hinchman, MD  09/24/2018, 10:13 AM

## 2018-09-25 ENCOUNTER — Encounter: Payer: Self-pay | Admitting: Gastroenterology

## 2018-10-01 ENCOUNTER — Encounter: Payer: Self-pay | Admitting: Gastroenterology

## 2018-10-24 ENCOUNTER — Other Ambulatory Visit: Payer: Self-pay | Admitting: Gastroenterology

## 2018-10-24 MED ORDER — PANTOPRAZOLE SODIUM 40 MG PO TBEC: 40.0000 mg | DELAYED_RELEASE_TABLET | Freq: Two times a day (BID) | ORAL | 0 refills | Status: DC

## 2018-10-24 NOTE — Telephone Encounter (Signed)
Patient verbalized understanding and informed patient I would fill medication to [facility].

## 2018-10-24 NOTE — Telephone Encounter (Addendum)
Tried to call patient but voicemail is not set up  

## 2018-10-24 NOTE — Telephone Encounter (Signed)
Per Dr. Ronelle Nigh-  Bonna Gains, Lennette Bihari, MD  Shel[facility] Mattocks  Caller: Unspecified (Today, 9:30 AM)        Lets increase her Protonix to 40 mg BID. She had increased lymphocytes on her esophagus biopsies and PPI is a treatment. You can send in the new prescription for 30 days, no refill. Let her know    Pended medication to send to pharmacy when she calls back.

## 2018-10-24 NOTE — Telephone Encounter (Signed)
-----   Message from [place], [place] sent at 10/24/2018  9:05 AM EDT -----  ----- Message ----- From: Virgel Manifold, MD Sent: 10/23/2018   3:15 PM EDT To: Martie Lee, LPN  Debbie please let patient know, her biopsies were benign.  Repeat colonoscopy should be in 10 years.  Please set recall.  If she still having problems swallowing?  Is she is still taking her PPI and how much?

## 2018-10-24 NOTE — Telephone Encounter (Signed)
Patient verbalized understanding of lab results. Put patient in 10 year recall list.  Patient states she is still having trouble swallowing and keeping stuff down every time she eats. Patient states she is taking the Protonix BID. Please advised what the next steps would be. Patient does have follow up appointment on 11/13/18

## 2018-11-13 ENCOUNTER — Ambulatory Visit: Payer: Self-pay | Admitting: Gastroenterology

## 2018-11-13 ENCOUNTER — Encounter: Payer: Self-pay | Admitting: Gastroenterology

## 2018-11-17 ENCOUNTER — Ambulatory Visit (INDEPENDENT_AMBULATORY_CARE_PROVIDER_SITE_OTHER): Payer: Self-pay

## 2018-11-17 ENCOUNTER — Ambulatory Visit
Admission: EM | Admit: 2018-11-17 | Discharge: 2018-11-17 | Disposition: A | Discharge status: Home / Self Care | Payer: Self-pay | Attending: Emergency Medicine | Admitting: Emergency Medicine

## 2018-11-17 DIAGNOSIS — F17210 Nicotine dependence, cigarettes, uncomplicated: Secondary | ICD-10-CM

## 2018-11-17 DIAGNOSIS — Z7189 Other specified counseling: Secondary | ICD-10-CM | POA: Insufficient documentation

## 2018-11-17 DIAGNOSIS — R0981 Nasal congestion: Secondary | ICD-10-CM

## 2018-11-17 DIAGNOSIS — R05 Cough: Secondary | ICD-10-CM

## 2018-11-17 DIAGNOSIS — R062 Wheezing: Secondary | ICD-10-CM

## 2018-11-17 DIAGNOSIS — J4521 Mild intermittent asthma with (acute) exacerbation: Secondary | ICD-10-CM | POA: Insufficient documentation

## 2018-11-17 MED ORDER — PREDNISONE 10 MG PO TABS: ORAL_TABLET | ORAL | 0 refills | Status: DC

## 2018-11-17 MED ORDER — BENZONATATE 100 MG PO CAPS: 100.0000 mg | ORAL_CAPSULE | Freq: Three times a day (TID) | ORAL | 0 refills | Status: DC | PRN

## 2018-11-17 MED ORDER — DOXYCYCLINE HYCLATE 100 MG PO CAPS: 100.0000 mg | ORAL_CAPSULE | Freq: Two times a day (BID) | ORAL | 0 refills | Status: DC

## 2018-11-17 NOTE — Discharge Instructions (Addendum)
Take medication as prescribed. Rest. Drink plenty of fluids. Please follow Covid 19 instructions.   Follow up with your primary care physician this week as needed. Return to Urgent care for new or worsening concerns.

## 2018-11-17 NOTE — ED Triage Notes (Signed)
Pt here for cough for 1 week that's productive, does have a hx of asthma and states her inhaler isn't working currently. 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 Chest heaviness that's worse when she stands up along with SOB. Did have a covid test back in July which was negative.

## 2018-11-17 NOTE — ED Provider Notes (Signed)
[facility] ____________________________________________  Time seen: Approximately 11:49 AM  I have reviewed the triage vital signs and the nursing notes.   HISTORY  Chief Complaint Cough   HPI Kellie Jensen is a 43 y.o. female past medical history of asthma presenting for evaluation of 1 week of cough and congestion complaints.  States symptoms have been present for 1 full week and not resolving.  Reports intermittent wheezing.  Has been using her inhaler 3-4 times a day without resolution and only minimal improvement.  States some nasal congestion and occasional sore throat.  States cough is occasionally productive of greenish mucus.  Continues to right eat and drink well.  No fevers.  States at rest she feels better, but reports when she gets up and going the wheezing increased and associated chest tightness and shortness of breath denies.  Denies any other chest discomfort.  No shortness of breath at rest.  Denies known direct sick contacts.  Does work around a lot of people.  No recent sickness or antibiotic use.  Is a smoker.  No LMP recorded. Patient has had a hysterectomy.     Past Medical History:  Diagnosis Date  . Anemia    H/O  . Arthritis    hands and back  . Asthma   . Complication of anesthesia    stopped breathing with hysterectomy, but since no problems  . Dyspnea   . Family history of adverse reaction to anesthesia    daughter "allergic" to local anesthesia  . GERD (gastroesophageal reflux disease)   . Hypoglycemic syndrome   . Migraines    migraines  . Neuromuscular disorder ([place])    some weakness/numbness when stressed  . SVT (supraventricular tachycardia) [facility])     Patient Active Problem List   Diagnosis Date Noted  . Intermittent abdominal pain   . Esophageal dysphagia   . Intractable vomiting   . Hypertrophy of anal papillae   . Diarrhea   . Rectal bleed     Past Surgical History:  Procedure Laterality Date  . ABDOMINAL  HYSTERECTOMY    . BREAST SURGERY Right    lumpectomy  . COLONOSCOPY WITH PROPOFOL N/A 09/24/2018   Procedure: COLONOSCOPY WITH BIOPSY;  Surgeon: Virgel Manifold, MD;  Location: [place];  Service: Endoscopy;  Laterality: N/A;  . ESOPHAGOGASTRODUODENOSCOPY (EGD) WITH PROPOFOL N/A 09/24/2018   Procedure: ESOPHAGOGASTRODUODENOSCOPY (EGD) WITH BIOPSY;  Surgeon: Virgel Manifold, MD;  Location: [place];  Service: Endoscopy;  Laterality: N/A;  . TUBAL LIGATION       No current facility-administered medications for this encounter.   Current Outpatient Medications:  .  albuterol (VENTOLIN HFA) 108 (90 Base) MCG/ACT inhaler, Inhale into the lungs every 6 (six) hours as needed for wheezing or shortness of breath., Disp: , Rfl:  .  omeprazole (PRILOSEC) 20 MG capsule, Take 20 mg by mouth daily., Disp: , Rfl:  .  pantoprazole (PROTONIX) 40 MG tablet, Take 1 tablet (40 mg total) by mouth 2 (two) times daily., Disp: 60 tablet, Rfl: 0 .  benzonatate (TESSALON PERLES) 100 MG capsule, Take 1 capsule (100 mg total) by mouth 3 (three) times daily as needed for cough., Disp: 15 capsule, Rfl: 0 .  doxycycline (VIBRAMYCIN) 100 MG capsule, Take 1 capsule (100 mg total) by mouth 2 (two) times daily., Disp: 20 capsule, Rfl: 0 .  pantoprazole (PROTONIX) 20 MG tablet, Take 2 tablets (40 mg total) by mouth daily., Disp: 90 tablet, Rfl: 0 .  predniSONE (DELTASONE)  10 MG tablet, Start 60 mg po day one, then 50 mg po day two, taper by 10 mg daily until complete., Disp: 21 tablet, Rfl: 0 .  promethazine (PHENERGAN) 25 MG tablet, Take by mouth., Disp: , Rfl:   Allergies Aspirin and Diphenhydramine hcl  Family History  Problem Relation Age of Onset  . Fibromyalgia Mother   . Asthma Mother   . Cancer Mother        breast    Social History Social History   Tobacco Use  . Smoking status: Current Every Day Smoker    Packs/day: 0.50    Years: 28.00    Pack years: 14.00  . Smokeless  tobacco: Never Used  . Tobacco comment: used to smoke 2 ppd but is trying to quit  Substance Use Topics  . Alcohol use: Not Currently  . Drug use: Not Currently    Review of Systems Constitutional: No fever ENT: As above. Cardiovascular: Denies chest pain. Respiratory: Denies current shortness of breath.  As above. Gastrointestinal: No abdominal pain. Musculoskeletal: Negative for back pain. Skin: Negative for rash.   ____________________________________________   PHYSICAL EXAM:  VITAL SIGNS: ED Triage Vitals  Enc Vitals Group     BP 11/17/18 1117 102/77     Pulse Rate 11/17/18 1117 99     Resp 11/17/18 1117 18     Temp 11/17/18 1117 97.9 F (36.6 C)     Temp Source 11/17/18 1117 Oral     SpO2 11/17/18 1117 97 %     Weight 11/17/18 1119 110 lb (49.9 kg)     Height --      Head Circumference --      Peak Flow --      Pain Score 11/17/18 1119 7     Pain Loc --      Pain Edu? --      Excl. in GC? --     Constitutional: Alert and oriented. Well appearing and in no acute distress. Eyes: Conjunctivae are normal.  Head: Atraumatic. No sinus tenderness to palpation. No swelling. No erythema.  Ears: no erythema, normal TMs bilaterally.   Nose:Nasal congestion   Mouth/Throat: Mucous membranes are moist. No pharyngeal erythema. No tonsillar swelling or exudate.  Neck: No stridor.  No cervical spine tenderness to palpation. Hematological/Lymphatic/Immunilogical: No cervical lymphadenopathy. Cardiovascular: Normal rate, regular rhythm. Grossly normal heart sounds.  Good peripheral circulation. Respiratory: Normal respiratory effort.  No retractions.  No rales or rhonchi.  Scattered inspiratory expiratory wheezes.  Dry intermittent cough. Musculoskeletal: Ambulatory with steady gait.  No extremity edema noted. Neurologic:  Normal speech and language. No gait instability. Skin:  Skin appears warm, dry and intact. No rash noted. Psychiatric: Mood and affect are normal. Speech  and behavior are normal. ___________________________________________   LABS (all labs ordered are listed, but only abnormal results are displayed)  Labs Reviewed  NOVEL CORONAVIRUS, NAA (HOSP ORDER, SEND-OUT TO REF LAB; TAT 18-24 HRS)   ____________________________________________  EKG ED ECG REPORT I, Renford Dills, the attending provider, personally viewed and interpreted this ECG.   Date: 11/17/2018  EKG Time: 1143  Rate: 75  Rhythm: normal sinus rhythm  Intervals: normal  ST&T Change: none noted  RADIOLOGY  Dg Chest 2 View  Result Date: 11/17/2018 CLINICAL DATA:  Cough for 1 week.  Shortness of breath.  Chest pain. EXAM: CHEST - 2 VIEW COMPARISON:  07/12/2018 FINDINGS: Normal heart size. No pleural effusion or edema. Lungs are hyperinflated but clear. No airspace opacities. IMPRESSION:  No active cardiopulmonary disease. Electronically Signed   By: Signa Kellaylor  Stroud M.D.   On: 11/17/2018 11:35   ____________________________________________   PROCEDURES Procedures   INITIAL IMPRESSION / ASSESSMENT AND PLAN / ED COURSE  Pertinent labs & imaging results that were available during my care of the patient were reviewed by me and considered in my medical decision making (see chart for details).  Overall well-appearing patient.  Coughing congestion complaints for the last 1 week.  Chest x-ray as above, no active cardiopulmonary disease.  Patient states that she has plenty of albuterol inhalers at home.  Suspect viral illness with asthma exacerbation.  Symptoms have persisted will place on oral doxycycline, prednisone, Tessalon Perles and continue home albuterol.  Counseled regarding COVID-19 and COVID-19 testing completed.  Symptoms have been present for over 1 week, work note given.  Encourage rest, fluids, supportive care, and discuss strict follow-up as needed.Discussed indication, risks and benefits of medications with patient.   Discussed follow up and return parameters  including no resolution or any worsening concerns. Patient verbalized understanding and agreed to plan.   ____________________________________________   FINAL CLINICAL IMPRESSION(S) / ED DIAGNOSES  Final diagnoses:  Intermittent asthma with acute exacerbation, unspecified asthma severity  Advice Given About Covid-19 Virus Infection     ED Discharge Orders         Ordered    predniSONE (DELTASONE) 10 MG tablet     11/17/18 1212    benzonatate (TESSALON PERLES) 100 MG capsule  3 times daily PRN     11/17/18 1212    doxycycline (VIBRAMYCIN) 100 MG capsule  2 times daily     11/17/18 1212           Note: This dictation was prepared with Dragon dictation along with smaller phrase technology. Any transcriptional errors that result from this process are unintentional.         Renford DillsMiller, Lakayla Barrington, NP 11/17/18 1351

## 2018-11-19 LAB — NOVEL CORONAVIRUS, NAA (HOSP ORDER, SEND-OUT TO REF LAB; TAT 18-24 HRS): SARS-CoV-2, NAA: NOT DETECTED

## 2019-01-21 ENCOUNTER — Ambulatory Visit
Admission: EM | Admit: 2019-01-21 | Discharge: 2019-01-21 | Disposition: A | Discharge status: Home / Self Care | Payer: Self-pay | Attending: Emergency Medicine | Admitting: Emergency Medicine

## 2019-01-21 ENCOUNTER — Other Ambulatory Visit: Payer: Self-pay

## 2019-01-21 ENCOUNTER — Encounter: Payer: Self-pay | Admitting: Emergency Medicine

## 2019-01-21 DIAGNOSIS — J4521 Mild intermittent asthma with (acute) exacerbation: Secondary | ICD-10-CM

## 2019-01-21 DIAGNOSIS — Z20822 Contact with and (suspected) exposure to covid-19: Secondary | ICD-10-CM

## 2019-01-21 DIAGNOSIS — Z7189 Other specified counseling: Secondary | ICD-10-CM

## 2019-01-21 DIAGNOSIS — Z20828 Contact with and (suspected) exposure to other viral communicable diseases: Secondary | ICD-10-CM

## 2019-01-21 DIAGNOSIS — F17210 Nicotine dependence, cigarettes, uncomplicated: Secondary | ICD-10-CM

## 2019-01-21 MED ORDER — BENZONATATE 100 MG PO CAPS: 100.0000 mg | ORAL_CAPSULE | Freq: Three times a day (TID) | ORAL | 0 refills | Status: DC | PRN

## 2019-01-21 MED ORDER — DOXYCYCLINE HYCLATE 100 MG PO CAPS: 100.0000 mg | ORAL_CAPSULE | Freq: Two times a day (BID) | ORAL | 0 refills | Status: DC

## 2019-01-21 MED ORDER — PREDNISONE 10 MG PO TABS: ORAL_TABLET | ORAL | 0 refills | Status: DC

## 2019-01-21 NOTE — Discharge Instructions (Signed)
It was very nice seeing you today in clinic. Thank you for entrusting me with your care.   Rest and increased fluid intake. Please utilize the medications that we discussed. Your prescriptions has been called in to your pharmacy. May use Tylenol and/or Ibuprofen as needed for discomfort/fever.   You were tested for SARS-CoV-2 (novel coronavirus) today. Testing is performed by an outside lab ([facility]) and has variable turn around times ranging between 2-5 days. Current recommendations from the the CDC and  DHHS require that you remain out of work in order to quarantine at home until negative test results are have been received. In the event that your test results are positive, you will be contacted with further directives. These measures are being implemented out of an abundance of caution to prevent transmission and spread during the current SARS-CoV-2 pandemic.   Make arrangements to follow up with your regular doctor in 1 week for re-evaluation if not improving. If your symptoms/condition worsens, please seek follow up care either here or in the ER. Please remember, our [place] providers are "right here with you" when you need [place].   Again, it was my pleasure to take care of you today. Thank you for choosing our clinic. I hope that you start to feel better quickly.   Honor Loh, MSN, APRN, FNP-C, CEN Advanced Practice Provider [facility]

## 2019-01-21 NOTE — ED Triage Notes (Signed)
Pt c/o cough, shortness of breath, nasal congestion, runny nose. Started about a week ago. She states that the shortness of breath is much worse, when she has her mask on. She states that she does not feel bad. Denies fever. She has know asthma and can hear herself wheezing.

## 2019-01-21 NOTE — ED Provider Notes (Signed)
Jensen, Kellie   Name: Kellie Jensen DOB: [birth year] MRN: [id number] CSN: [id number] PCP: Patient, No Pcp Per  Arrival date and time:  01/21/19 1349  Chief Complaint:  Cough and Shortness of Breath   NOTE: Prior to seeing the patient today, I have reviewed the triage nursing documentation and vital signs. Clinical staff has updated patient's PMH/PSHx, current medication list, and drug allergies/intolerances to ensure comprehensive history available to assist in medical decision making.   History:   HPI: Kellie Jensen is a 43 y.o. female who presents today with complaints of worsening cough, congestion, and shortness of breath x 1 week. Cough has been non-productive. She denies any associated fevers. Patient has had a generalized headache which she attributes to her forceful coughing. PMH (+) for asthma. Patient has been using her prescribed inhaler, however notes that her breathing is "much worse". Patient has been having trouble catching her breathing following minimal exertion. She states, "I can hear myself wheezing". She denies that she has experienced any nausea, vomiting, diarrhea, or abdominal pain. She is eating and drinking well. Patient denies any perceived alterations to her sense of taste or smell. Daughter recently ill with "a cold"; recovered without need for treatment. 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 Outside of the aforementioned SABA inhaler, patient reports that she has not used any interventions to improve her symptoms.    Past Medical History:  Diagnosis Date  . Anemia    H/O  . Arthritis    hands and back  . Asthma   . Complication of anesthesia    stopped breathing with hysterectomy, but since no problems  . Dyspnea   . Family history of adverse reaction to anesthesia    daughter "allergic" to local anesthesia  . GERD (gastroesophageal reflux  disease)   . Hypoglycemic syndrome   . Migraines    migraines  . Neuromuscular disorder ([facility])    some weakness/numbness when stressed  . SVT (supraventricular tachycardia) ([facility])     Past Surgical History:  Procedure Laterality Date  . ABDOMINAL HYSTERECTOMY    . BREAST SURGERY Right    lumpectomy  . COLONOSCOPY WITH PROPOFOL N/A 09/24/2018   Procedure: COLONOSCOPY WITH BIOPSY;  Surgeon: Pasty Spillers, MD;  Location[facility];  Service: Endoscopy;  Laterality: N/A;  . ESOPHAGOGASTRODUODENOSCOPY (EGD) WITH PROPOFOL N/A 09/24/2018   Procedure: ESOPHAGOGASTRODUODENOSCOPY (EGD) WITH BIOPSY;  Surgeon: Pasty Spillers, MD;  Location[facility];  Service: Endoscopy;  Laterality: N/A;  . TUBAL LIGATION      Family History  Problem Relation Age of Onset  . Fibromyalgia Mother   . Asthma Mother   . Cancer Mother        breast    Social History   Tobacco Use  . Smoking status: Current Every Day Smoker    Packs/day: 0.50    Years: 28.00    Pack years: 14.00  . Smokeless tobacco: Never Used  . Tobacco comment: used to smoke 2 ppd but is trying to quit  Substance Use Topics  . Alcohol use: Not Currently  . Drug use: Not Currently    Patient Active Problem List   Diagnosis Date Noted  . Intermittent abdominal pain   . Esophageal dysphagia   . Intractable vomiting   . Hypertrophy of anal papillae   . Diarrhea   . Rectal bleed     Home Medications:  Current Meds  Medication Sig  . albuterol (VENTOLIN HFA) 108 (90 Base) MCG/ACT inhaler Inhale into the lungs every 6 (six) hours as needed for wheezing or shortness of breath.    Allergies:   Aspirin and Diphenhydramine hcl  Review of Systems (ROS): Review of Systems  Constitutional: Negative for fatigue and fever.  HENT: Positive for congestion. Negative for ear pain, postnasal drip, rhinorrhea, sinus pressure, sinus pain, sneezing and sore throat.   Eyes: Negative for pain, discharge and  redness.  Respiratory: Positive for cough, chest tightness, shortness of breath and wheezing.        PMH (+) asthma  Cardiovascular: Negative for chest pain and palpitations.  Gastrointestinal: Negative for abdominal pain, diarrhea, nausea and vomiting.  Musculoskeletal: Negative for arthralgias, back pain, myalgias and neck pain.  Skin: Negative for color change, pallor and rash.  Neurological: Positive for headaches. Negative for dizziness, syncope and weakness.  Hematological: Negative for adenopathy.     Vital Signs: Today's Vitals   01/21/19 1410 01/21/19 1413  BP:  108/74  Pulse:  78  Resp:  20  Temp:  98.2 F (36.8 C)  TempSrc:  Oral  SpO2:  96%  Weight: 115 lb (52.2 kg)   Height: 5\' 3"  (1.6 m)   PainSc: 2      Physical Exam: Physical Exam  Constitutional: She is oriented to person, place, and time and well-developed, well-nourished, and in no distress. No distress.  HENT:  Head: Normocephalic and atraumatic.  Right Ear: Tympanic membrane normal.  Left Ear: Tympanic membrane normal.  Nose: Nose normal.  Mouth/Throat: Uvula is midline, oropharynx is clear and moist and mucous membranes are normal.  Eyes: Pupils are equal, round, and reactive to light. Conjunctivae and EOM are normal.  Neck: Normal range of motion. Neck supple.  Cardiovascular: Normal rate, regular rhythm, normal heart sounds and intact distal pulses.  Pulmonary/Chest: Effort normal. No respiratory distress. She has no decreased breath sounds. She has wheezes (expiratory wheezing throughout). She has no rhonchi. She has no rales.  Deep cough in clinic; dry. No respiratory distress. SPO2 96% on RA.   Abdominal: Soft. Normal appearance and bowel sounds are normal. She exhibits no distension. There is no abdominal tenderness.  Musculoskeletal: Normal range of motion.  Neurological: She is alert and oriented to person, place, and time. Gait normal.  Skin: Skin is warm and dry. No rash noted. She is not  diaphoretic.  Psychiatric: Mood, memory, affect and judgment normal.  Nursing note and vitals reviewed.   Urgent Care Treatments / Results:   LABS: PLEASE NOTE: all labs that were ordered this encounter are listed, however only abnormal results are displayed. Labs Reviewed  NOVEL CORONAVIRUS, NAA (HOSP ORDER, SEND-OUT TO REF LAB; TAT 18-24 HRS)    EKG: -None  RADIOLOGY: No results found.  PROCEDURES: Procedures  MEDICATIONS RECEIVED THIS VISIT: Medications - No data to display  PERTINENT CLINICAL COURSE NOTES/UPDATES:   Initial Impression / Assessment and Plan / Urgent Care Course:  Pertinent labs & imaging results that were available during my care of the patient were personally reviewed by me and considered in my medical decision making (see lab/imaging section of note for values and interpretations).  Kellie QuailsJessica Axe is a 43 y.o. female [facility] with complaints of Cough and Shortness of Breath  Patient overall well appearing and in no acute distress today in clinic. Presenting symptoms (see HPI) and exam as documented above. She presents with symptoms associated  with SARS-CoV-2 (novel coronavirus). Discussed typical symptom constellation. Reviewed potential for infection and need for testing. Patient amenable to being tested. SARS-CoV-2 swab collected by certified clinical staff. Discussed variable turn around times associated with testing, as swabs are being processed at [facility], and have been taking between 2-5 days to come back. She was advised to self quarantine, per [facility] DHHS guidelines, until negative results received.   PMH (+) asthma. Patient notes that this feels similar to previous flares. She has not had any fevers. Exam reveals mainly wheezing. SPO2 is at baseline. Will defer repeat imaging of the chest. Discussed that SARS-CoV-2 remains part of the differential until confirmatory lab testing rules out infection; testing is pending at this  time. Will proceed with treatment for URI with concurrent asthma flare using a 10 day course of oral doxycycline and a systemic steroid taper. Discussed supportive care measures at home during acute phase of illness. Patient to rest as much as possible. She was encouraged to ensure adequate hydration (water and ORS) to prevent dehydration and electrolyte derangements. Patient may use APAP and/or IBU on an as needed basis for pain/fever. Will also send in a supply of benzonatate for PRN using to help with her cough. Patient to supplement with prescribed MDI as needed for SOB and significant wheezing.   Current clinical condition warrants patient being out of work in order to quarantine while waiting for testing results. She was provided with the appropriate documentation to provide to her place of employment that will allow for her to RTW on 01/24/2019 with no restrictions. RTW is contingent on her SARS-CoV-2 test results being reviewed as negative.   Discussed follow up with primary care physician in 1 week for re-evaluation. I have reviewed the follow up and strict return precautions for any new or worsening symptoms. Patient is aware of symptoms that would be deemed urgent/emergent, and would thus require further evaluation either here or in the emergency department. At the time of discharge, she verbalized understanding and consent with the discharge plan as it was reviewed with her. All questions were fielded by provider and/or clinic staff prior to patient discharge.    Final Clinical Impressions / Urgent Care Diagnoses:   Final diagnoses:  Intermittent asthma with acute exacerbation, unspecified asthma severity  Encounter for laboratory testing for COVID-19 virus  Advice given about COVID-19 virus infection    New Prescriptions:  [place] Controlled Substance Registry consulted? Not Applicable  Meds ordered this encounter  Medications  . benzonatate (TESSALON PERLES) 100 MG capsule    Sig: Take 1  capsule (100 mg total) by mouth 3 (three) times daily as needed for cough.    Dispense:  15 capsule    Refill:  0  . doxycycline (VIBRAMYCIN) 100 MG capsule    Sig: Take 1 capsule (100 mg total) by mouth 2 (two) times daily.    Dispense:  20 capsule    Refill:  0  . predniSONE (DELTASONE) 10 MG tablet    Sig: 60 mg x 1 day, 50 mg x 1 day, 40 mg x 1 day, 30 mg x 1 day, 20 mg x 1 day, 10 mg x 1 day    Dispense:  21 tablet    Refill:  0    Recommended Follow up Care:  Patient encouraged to follow up with the following provider within the specified time frame, or sooner as dictated by the severity of her symptoms. As always, she was instructed that for any urgent/emergent care needs,  she should seek care either here or in the emergency department for more immediate evaluation.  Follow-up Information    PCP In 1 week.   Why: General reassessment of symptoms if not improving        NOTE: This note was prepared using Scientist, clinical (histocompatibility and immunogenetics) along with smaller Lobbyist. Despite my best ability to proofread, there is the potential that transcriptional errors may still occur from this process, and are completely unintentional.    Verlee Monte, NP 01/22/19 2048

## 2019-01-22 LAB — NOVEL CORONAVIRUS, NAA (HOSP ORDER, SEND-OUT TO REF LAB; TAT 18-24 HRS): SARS-CoV-2, NAA: NOT DETECTED

## 2019-08-27 ENCOUNTER — Ambulatory Visit (INDEPENDENT_AMBULATORY_CARE_PROVIDER_SITE_OTHER): Payer: Self-pay

## 2019-08-27 ENCOUNTER — Other Ambulatory Visit: Payer: Self-pay

## 2019-08-27 ENCOUNTER — Ambulatory Visit
Admission: EM | Admit: 2019-08-27 | Discharge: 2019-08-27 | Disposition: A | Discharge status: Home / Self Care | Payer: Self-pay | Attending: Family Medicine | Admitting: Family Medicine

## 2019-08-27 DIAGNOSIS — S2241XA Multiple fractures of ribs, right side, initial encounter for closed fracture: Secondary | ICD-10-CM

## 2019-08-27 MED ORDER — HYDROCODONE-ACETAMINOPHEN 5-325 MG PO TABS: ORAL_TABLET | ORAL | 0 refills | Status: AC

## 2019-08-27 NOTE — Discharge Instructions (Addendum)
Ice, over the counter tylenol/advil

## 2019-08-27 NOTE — ED Provider Notes (Signed)
[facility]    CSN: [id number] Arrival date & time: 08/27/19  1015      History   Chief Complaint Chief Complaint  Patient presents with  . Rib Injury    HPI Kellie Jensen is a 44 y.o. female.   44 yo female with a c/o right rib/chest wall pain since injuring it 3 days ago. States she was at a hotel going down some steps and fell hitting her right chest wall/ribs on the handrail. Denies hitting her head or loss of consciousness. States pain is worse with movement, coughing, sneezing or deep breaths. States her bra helps with the discomfort.      Past Medical History:  Diagnosis Date  . Anemia    H/O  . Arthritis    hands and back  . Asthma   . Complication of anesthesia    stopped breathing with hysterectomy, but since no problems  . Dyspnea   . Family history of adverse reaction to anesthesia    daughter "allergic" to local anesthesia  . GERD (gastroesophageal reflux disease)   . Hypoglycemic syndrome   . Migraines    migraines  . Neuromuscular disorder (HCC)    some weakness/numbness when stressed  . SVT (supraventricular tachycardia) [facility])     Patient Active Problem List   Diagnosis Date Noted  . Intermittent abdominal pain   . Esophageal dysphagia   . Intractable vomiting   . Hypertrophy of anal papillae   . Diarrhea   . Rectal bleed     Past Surgical History:  Procedure Laterality Date  . ABDOMINAL HYSTERECTOMY    . BREAST SURGERY Right    lumpectomy  . COLONOSCOPY WITH PROPOFOL N/A 09/24/2018   Procedure: COLONOSCOPY WITH BIOPSY;  Surgeon: Pasty Spillers, MD;  Location: [facility];  Service: Endoscopy;  Laterality: N/A;  . ESOPHAGOGASTRODUODENOSCOPY (EGD) WITH PROPOFOL N/A 09/24/2018   Procedure: ESOPHAGOGASTRODUODENOSCOPY (EGD) WITH BIOPSY;  Surgeon: Pasty Spillers, MD;  Location: [facility];  Service: Endoscopy;  Laterality: N/A;  . TUBAL LIGATION      OB History   No obstetric history on file.       Home Medications    Prior to Admission medications   Medication Sig Start Date End Date Taking? Authorizing Provider  albuterol (VENTOLIN HFA) 108 (90 Base) MCG/ACT inhaler Inhale into the lungs every 6 (six) hours as needed for wheezing or shortness of breath.    [provider]  HYDROcodone-acetaminophen (NORCO/VICODIN) 5-325 MG tablet 1-2 tabs po bid prn 08/27/19   Payton Mccallum, MD  omeprazole (PRILOSEC) 20 MG capsule Take 20 mg by mouth daily.  01/21/19  [provider]  promethazine (PHENERGAN) 25 MG tablet Take by mouth. 09/15/18 01/21/19  [provider]    Family History Family History  Problem Relation Age of Onset  . Fibromyalgia Mother   . Asthma Mother   . Cancer Mother        breast    Social History Social History   Tobacco Use  . Smoking status: Current Every Day Smoker    Packs/day: 0.50    Years: 28.00    Pack years: 14.00  . Smokeless tobacco: Never Used  . Tobacco comment: used to smoke 2 ppd but is trying to quit  Vaping Use  . Vaping Use: Every day  Substance Use Topics  . Alcohol use: Not Currently  . Drug use: Not Currently     Allergies   Aspirin and Diphenhydramine hcl  Review of Systems Review of Systems   Physical Exam Triage Vital Signs ED Triage Vitals  Enc Vitals Group     BP 08/27/19 1024 122/69     Pulse Rate 08/27/19 1024 94     Resp 08/27/19 1024 16     Temp 08/27/19 1024 98 F (36.7 C)     Temp src --      SpO2 08/27/19 1024 99 %     Weight --      Height --      Head Circumference --      Peak Flow --      Pain Score 08/27/19 1025 9     Pain Loc --      Pain Edu? --      Excl. in GC? --    No data found.  Updated Vital Signs BP 122/69   Pulse 94   Temp 98 F (36.7 C)   Resp 16   SpO2 99%   Visual Acuity Right Eye Distance:   Left Eye Distance:   Bilateral Distance:    Right Eye Near:   Left Eye Near:    Bilateral Near:     Physical Exam Vitals and nursing note  reviewed.  Constitutional:      General: She is not in acute distress.    Appearance: She is not toxic-appearing or diaphoretic.  Cardiovascular:     Rate and Rhythm: Normal rate.     Heart sounds: Normal heart sounds.  Pulmonary:     Effort: Pulmonary effort is normal. No respiratory distress.     Breath sounds: Normal breath sounds.  Chest:     Chest wall: Tenderness (right mid chest ribs) present.  Neurological:     Mental Status: She is alert.      UC Treatments / Results  Labs (all labs ordered are listed, but only abnormal results are displayed) Labs Reviewed - No data to display  EKG   Radiology DG Ribs Unilateral W/Chest Right  Result Date: 08/27/2019 CLINICAL DATA:  Right rib pain secondary to a fall going down steps 3 days ago. EXAM: RIGHT RIBS AND CHEST - 3+ VIEW COMPARISON:  Chest x-ray dated 11/17/2018 FINDINGS: There are fractures of anterior aspects of the right fifth and sixth ribs with slight displacement. The patient has old fractures of right third and fourth and sixth and seventh ribs. No pneumothorax. No pleural effusion or lung contusion. Heart size and vascularity are normal. No infiltrates. IMPRESSION: Acute fractures of the anterior aspects of the right fifth and sixth ribs. Electronically Signed   By: Francene Boyers M.D.   On: 08/27/2019 11:23    Procedures Procedures (including critical care time)  Medications Ordered in UC Medications - No data to display  Initial Impression / Assessment and Plan / UC Course  I have reviewed the triage vital signs and the nursing notes.  Pertinent labs & imaging results that were available during my care of the patient were reviewed by me and considered in my medical decision making (see chart for details).      Final Clinical Impressions(s) / UC Diagnoses   Final diagnoses:  Closed fracture of multiple ribs of right side, initial encounter   (2 ribs- 5th and 6th)  ED Prescriptions    Medication Sig  Dispense Auth. Provider   HYDROcodone-acetaminophen (NORCO/VICODIN) 5-325 MG tablet 1-2 tabs po bid prn 12 tablet Payton Mccallum, MD      1. x-ray results and diagnosis reviewed with patient 2.  rx as per orders above; reviewed possible side effects, interactions, risks and benefits  3. Recommend supportive treatment with rest, ice, otc analgesics prn 4. Follow-up prn if symptoms worsen or don't improve   I have reviewed the PDMP during this encounter.   Norval Gable, MD 08/27/19 [phone]

## 2019-08-27 NOTE — ED Triage Notes (Addendum)
Pt states tripped down 4-5 steps 3 days ago, c/o pain to right rib cage after landing on handrail. Did not hit head, no LOC. Ambulatory, no obvious deformities. Pt states wearing bra helps with discomfort.

## 2019-09-24 ENCOUNTER — Ambulatory Visit: Admission: EM | Admit: 2019-09-24 | Discharge: 2019-09-24 | Discharge status: Against Medical Advice | Payer: Self-pay

## 2019-09-24 ENCOUNTER — Other Ambulatory Visit: Payer: Self-pay

## 2022-01-11 IMAGING — CR DG RIBS W/ CHEST 3+V*R*
5 series · 5 of 5 positions shown · non-contrast
Comparison: Chest x-ray dated 11/17/2018

CLINICAL DATA: Right rib pain secondary to a fall going down steps
3 days ago.

EXAM:
RIGHT RIBS AND CHEST - 3+ VIEW

[chest pa]
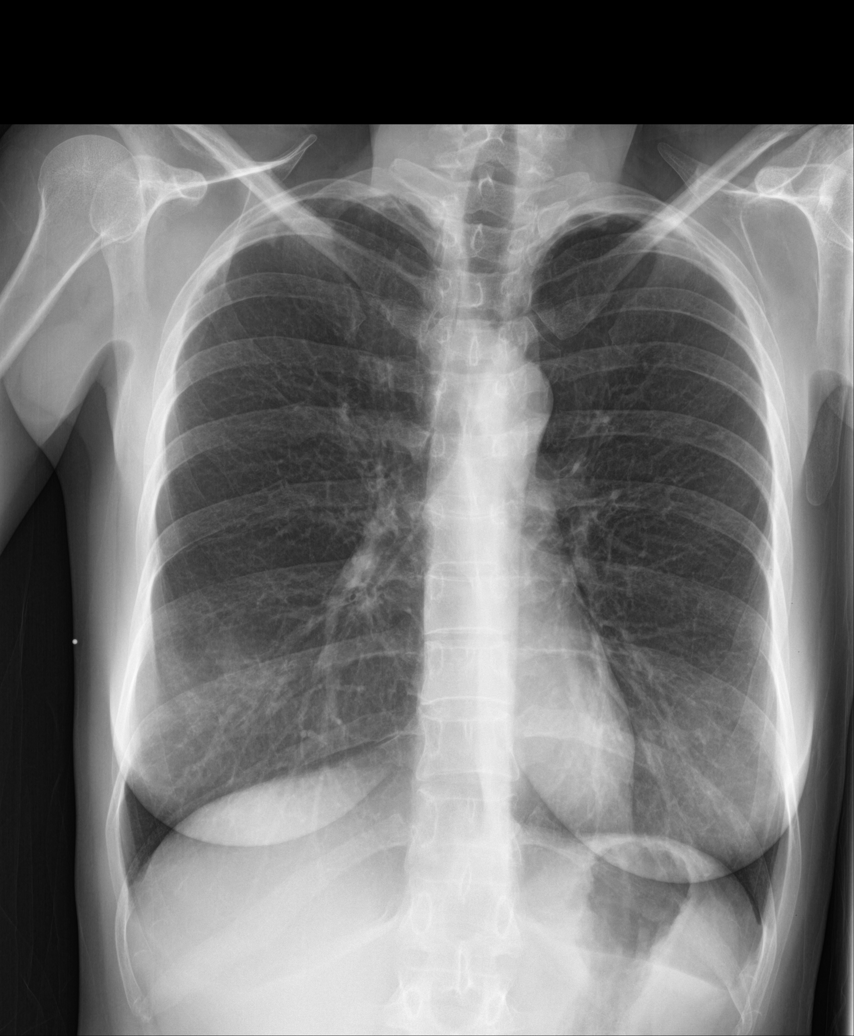

[rib pa (1 of 2)]
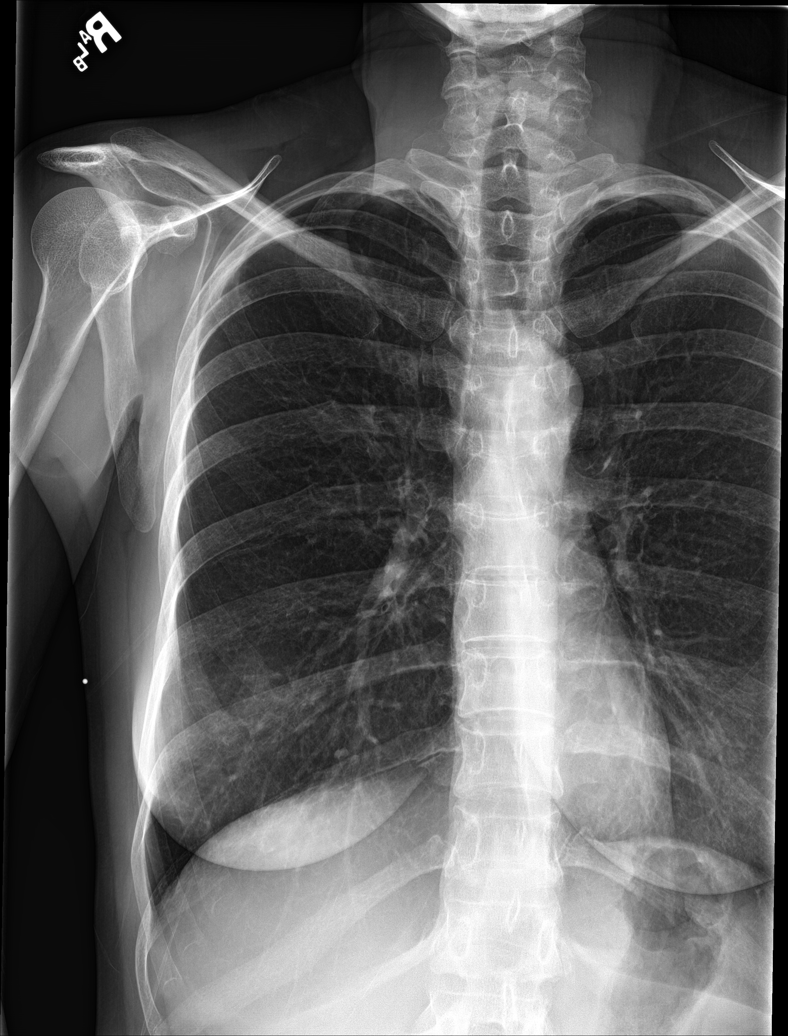

[rib pa (2 of 2)]
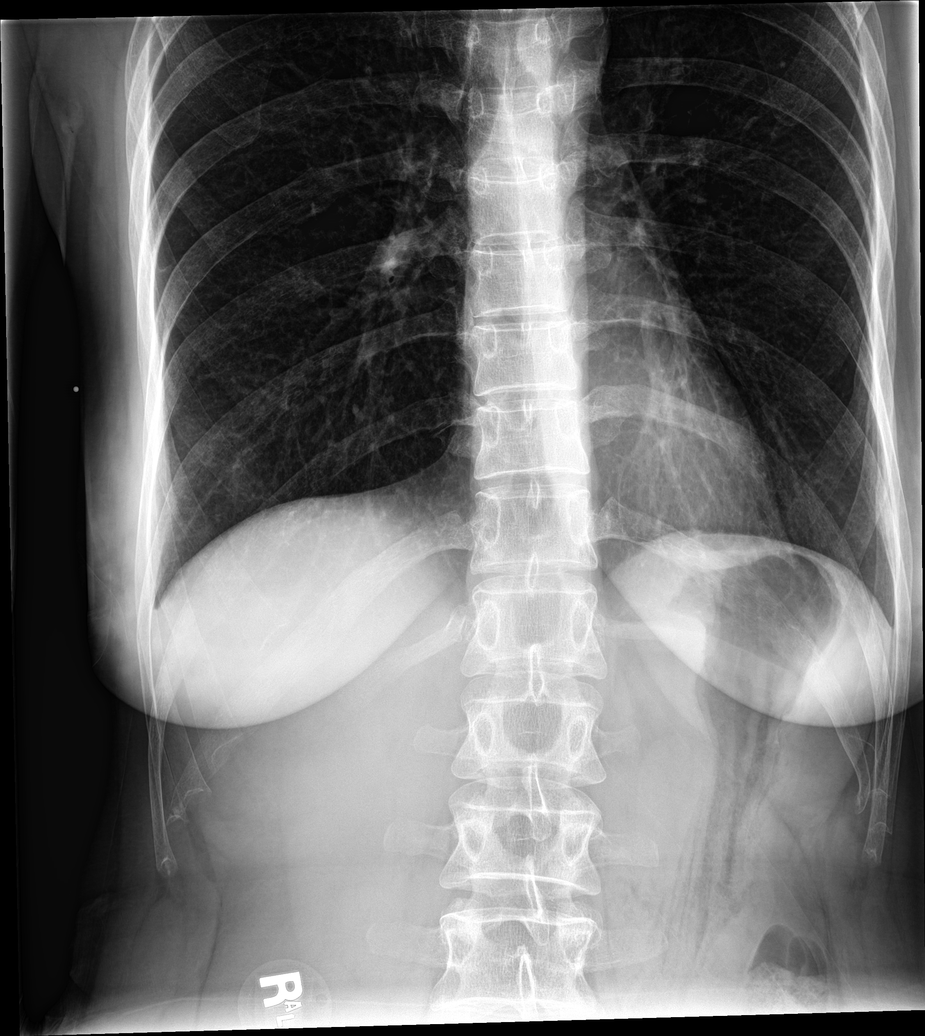

[rib obl (1 of 2)]
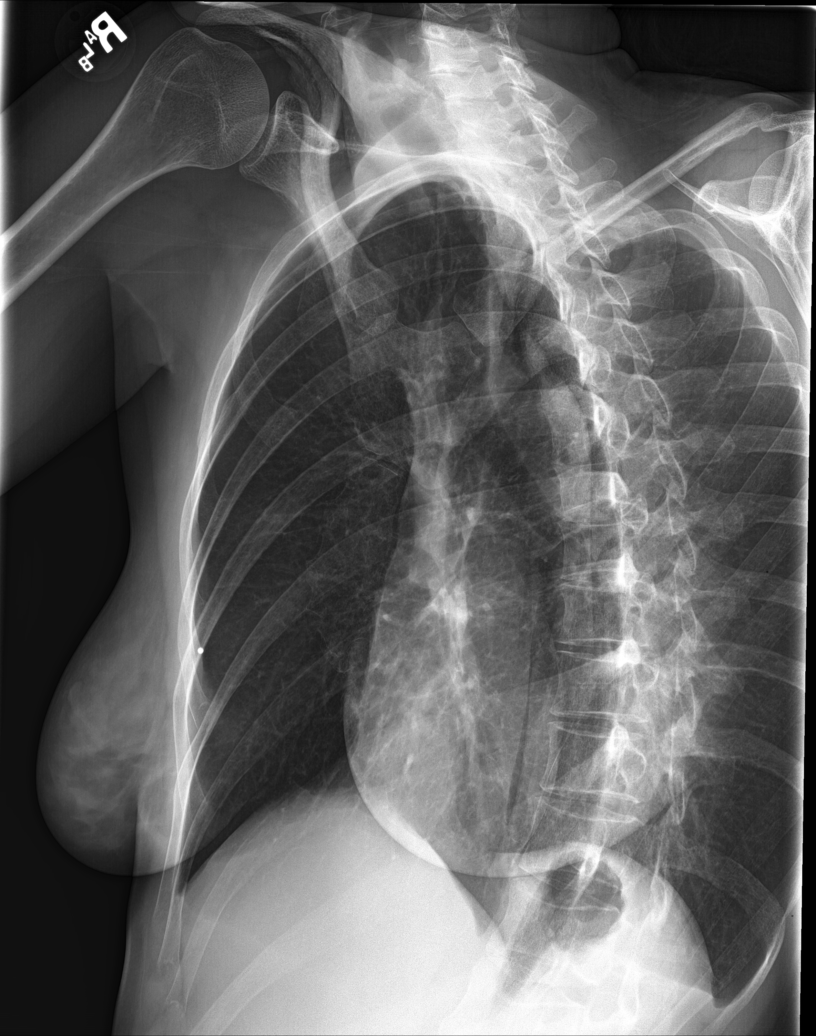

[rib obl (2 of 2)]
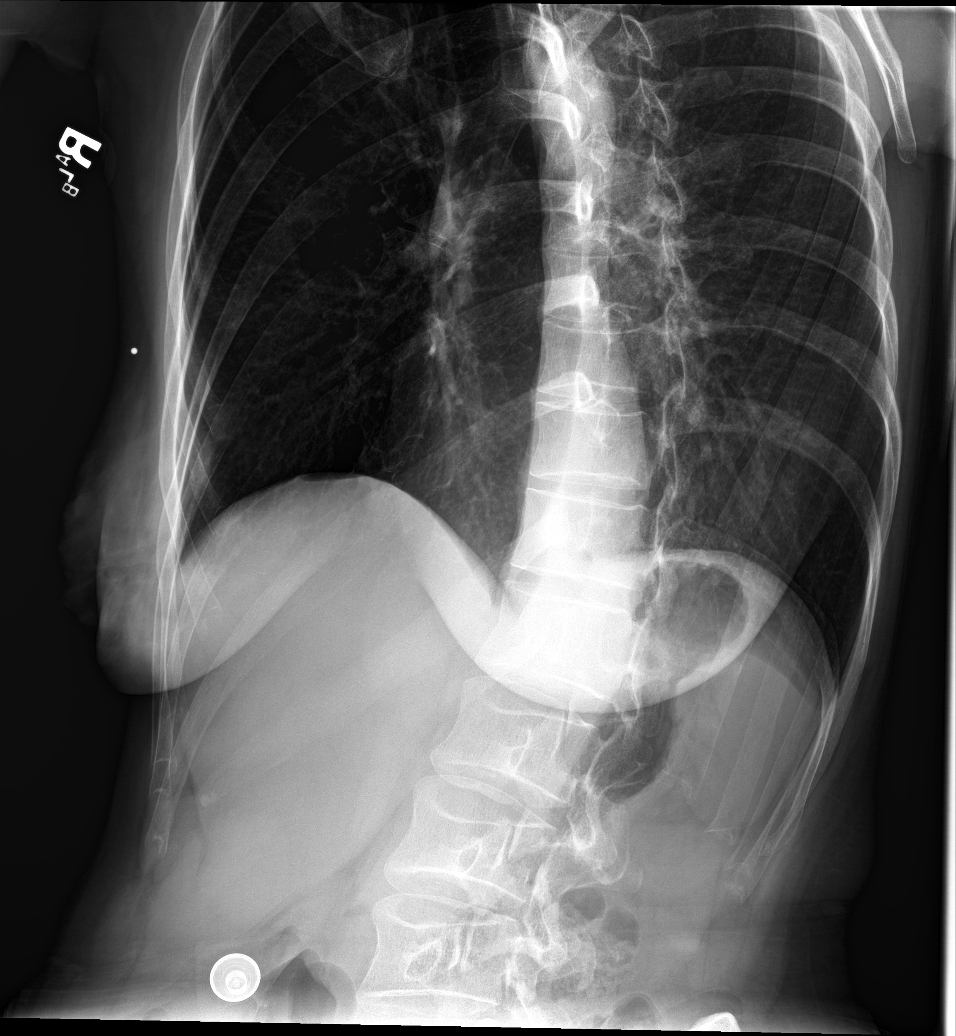

[5 of 5 positions shown; findings below may reference images not displayed]

FINDINGS: There are fractures of anterior aspects of the right fifth and sixth
ribs with slight displacement. The patient has old fractures of
right third and fourth and sixth and seventh ribs.

No pneumothorax. No pleural effusion or lung contusion. Heart size
and vascularity are normal. No infiltrates.
IMPRESSION: Acute fractures of the anterior aspects of the right fifth and sixth
ribs.
